# Patient Record
Sex: Female | Born: 1940 | Race: Black or African American | Hispanic: No
Health system: Southern US, Community
[De-identification: ages and names within clinical notes are randomized; demographics above are authoritative.]

---

## 2015-04-10 DIAGNOSIS — G603 Idiopathic progressive neuropathy: Secondary | ICD-10-CM | POA: Diagnosis not present

## 2015-04-10 DIAGNOSIS — G458 Other transient cerebral ischemic attacks and related syndromes: Secondary | ICD-10-CM | POA: Diagnosis not present

## 2015-04-10 DIAGNOSIS — I10 Essential (primary) hypertension: Secondary | ICD-10-CM | POA: Diagnosis not present

## 2015-04-10 DIAGNOSIS — K581 Irritable bowel syndrome with constipation: Secondary | ICD-10-CM | POA: Diagnosis not present

## 2015-04-10 DIAGNOSIS — Z6823 Body mass index (BMI) 23.0-23.9, adult: Secondary | ICD-10-CM | POA: Diagnosis not present

## 2015-04-13 DIAGNOSIS — I6523 Occlusion and stenosis of bilateral carotid arteries: Secondary | ICD-10-CM | POA: Diagnosis not present

## 2015-05-09 DIAGNOSIS — Z23 Encounter for immunization: Secondary | ICD-10-CM | POA: Diagnosis not present

## 2015-07-05 DIAGNOSIS — K219 Gastro-esophageal reflux disease without esophagitis: Secondary | ICD-10-CM | POA: Diagnosis not present

## 2015-07-05 DIAGNOSIS — I10 Essential (primary) hypertension: Secondary | ICD-10-CM | POA: Diagnosis not present

## 2015-07-05 DIAGNOSIS — E785 Hyperlipidemia, unspecified: Secondary | ICD-10-CM | POA: Diagnosis not present

## 2015-07-05 DIAGNOSIS — H9193 Unspecified hearing loss, bilateral: Secondary | ICD-10-CM | POA: Diagnosis not present

## 2015-07-11 DIAGNOSIS — H1852 Epithelial (juvenile) corneal dystrophy: Secondary | ICD-10-CM | POA: Diagnosis not present

## 2015-07-11 DIAGNOSIS — Z961 Presence of intraocular lens: Secondary | ICD-10-CM | POA: Diagnosis not present

## 2015-07-11 DIAGNOSIS — H401132 Primary open-angle glaucoma, bilateral, moderate stage: Secondary | ICD-10-CM | POA: Diagnosis not present

## 2015-07-13 DIAGNOSIS — I10 Essential (primary) hypertension: Secondary | ICD-10-CM | POA: Diagnosis not present

## 2015-07-13 DIAGNOSIS — Z6823 Body mass index (BMI) 23.0-23.9, adult: Secondary | ICD-10-CM | POA: Diagnosis not present

## 2015-07-13 DIAGNOSIS — K581 Irritable bowel syndrome with constipation: Secondary | ICD-10-CM | POA: Diagnosis not present

## 2015-07-20 DIAGNOSIS — Z1231 Encounter for screening mammogram for malignant neoplasm of breast: Secondary | ICD-10-CM | POA: Diagnosis not present

## 2015-08-08 DIAGNOSIS — H401131 Primary open-angle glaucoma, bilateral, mild stage: Secondary | ICD-10-CM | POA: Diagnosis not present

## 2015-10-13 DIAGNOSIS — I10 Essential (primary) hypertension: Secondary | ICD-10-CM | POA: Diagnosis not present

## 2015-10-13 DIAGNOSIS — Z0000 Encounter for general adult medical examination without abnormal findings: Secondary | ICD-10-CM | POA: Diagnosis not present

## 2015-10-13 DIAGNOSIS — J440 Chronic obstructive pulmonary disease with acute lower respiratory infection: Secondary | ICD-10-CM | POA: Diagnosis not present

## 2015-10-13 DIAGNOSIS — Z1389 Encounter for screening for other disorder: Secondary | ICD-10-CM | POA: Diagnosis not present

## 2015-10-16 DIAGNOSIS — Z23 Encounter for immunization: Secondary | ICD-10-CM | POA: Diagnosis not present

## 2016-01-18 DIAGNOSIS — J449 Chronic obstructive pulmonary disease, unspecified: Secondary | ICD-10-CM | POA: Diagnosis not present

## 2016-01-18 DIAGNOSIS — R10817 Generalized abdominal tenderness: Secondary | ICD-10-CM | POA: Diagnosis not present

## 2016-01-18 DIAGNOSIS — J069 Acute upper respiratory infection, unspecified: Secondary | ICD-10-CM | POA: Diagnosis not present

## 2016-01-18 DIAGNOSIS — Z6822 Body mass index (BMI) 22.0-22.9, adult: Secondary | ICD-10-CM | POA: Diagnosis not present

## 2016-01-18 DIAGNOSIS — I700 Atherosclerosis of aorta: Secondary | ICD-10-CM | POA: Diagnosis not present

## 2016-01-18 DIAGNOSIS — J22 Unspecified acute lower respiratory infection: Secondary | ICD-10-CM | POA: Diagnosis not present

## 2016-01-18 DIAGNOSIS — I10 Essential (primary) hypertension: Secondary | ICD-10-CM | POA: Diagnosis not present

## 2016-01-18 DIAGNOSIS — J440 Chronic obstructive pulmonary disease with acute lower respiratory infection: Secondary | ICD-10-CM | POA: Diagnosis not present

## 2016-01-23 DIAGNOSIS — R10817 Generalized abdominal tenderness: Secondary | ICD-10-CM | POA: Diagnosis not present

## 2016-01-23 DIAGNOSIS — R109 Unspecified abdominal pain: Secondary | ICD-10-CM | POA: Diagnosis not present

## 2016-01-23 DIAGNOSIS — I700 Atherosclerosis of aorta: Secondary | ICD-10-CM | POA: Diagnosis not present

## 2016-04-30 DIAGNOSIS — R10817 Generalized abdominal tenderness: Secondary | ICD-10-CM | POA: Diagnosis not present

## 2016-04-30 DIAGNOSIS — I700 Atherosclerosis of aorta: Secondary | ICD-10-CM | POA: Diagnosis not present

## 2016-04-30 DIAGNOSIS — J440 Chronic obstructive pulmonary disease with acute lower respiratory infection: Secondary | ICD-10-CM | POA: Diagnosis not present

## 2016-04-30 DIAGNOSIS — Z6823 Body mass index (BMI) 23.0-23.9, adult: Secondary | ICD-10-CM | POA: Diagnosis not present

## 2016-04-30 DIAGNOSIS — I10 Essential (primary) hypertension: Secondary | ICD-10-CM | POA: Diagnosis not present

## 2016-05-11 DIAGNOSIS — R69 Illness, unspecified: Secondary | ICD-10-CM | POA: Diagnosis not present

## 2016-06-06 DIAGNOSIS — I700 Atherosclerosis of aorta: Secondary | ICD-10-CM | POA: Diagnosis not present

## 2016-06-06 DIAGNOSIS — J440 Chronic obstructive pulmonary disease with acute lower respiratory infection: Secondary | ICD-10-CM | POA: Diagnosis not present

## 2016-06-06 DIAGNOSIS — Z6823 Body mass index (BMI) 23.0-23.9, adult: Secondary | ICD-10-CM | POA: Diagnosis not present

## 2016-06-06 DIAGNOSIS — I10 Essential (primary) hypertension: Secondary | ICD-10-CM | POA: Diagnosis not present

## 2016-06-06 DIAGNOSIS — M542 Cervicalgia: Secondary | ICD-10-CM | POA: Diagnosis not present

## 2016-07-08 DIAGNOSIS — Z6823 Body mass index (BMI) 23.0-23.9, adult: Secondary | ICD-10-CM | POA: Diagnosis not present

## 2016-07-08 DIAGNOSIS — N3080 Other cystitis without hematuria: Secondary | ICD-10-CM | POA: Diagnosis not present

## 2016-07-18 DIAGNOSIS — R87610 Atypical squamous cells of undetermined significance on cytologic smear of cervix (ASC-US): Secondary | ICD-10-CM | POA: Diagnosis not present

## 2016-07-18 DIAGNOSIS — Z01419 Encounter for gynecological examination (general) (routine) without abnormal findings: Secondary | ICD-10-CM | POA: Diagnosis not present

## 2016-07-18 DIAGNOSIS — R300 Dysuria: Secondary | ICD-10-CM | POA: Diagnosis not present

## 2016-07-18 DIAGNOSIS — Z01411 Encounter for gynecological examination (general) (routine) with abnormal findings: Secondary | ICD-10-CM | POA: Diagnosis not present

## 2016-07-18 DIAGNOSIS — N390 Urinary tract infection, site not specified: Secondary | ICD-10-CM | POA: Diagnosis not present

## 2016-07-18 DIAGNOSIS — R634 Abnormal weight loss: Secondary | ICD-10-CM | POA: Diagnosis not present

## 2016-07-30 DIAGNOSIS — Z1231 Encounter for screening mammogram for malignant neoplasm of breast: Secondary | ICD-10-CM | POA: Diagnosis not present

## 2016-08-06 DIAGNOSIS — R634 Abnormal weight loss: Secondary | ICD-10-CM | POA: Diagnosis not present

## 2016-08-06 DIAGNOSIS — D649 Anemia, unspecified: Secondary | ICD-10-CM | POA: Diagnosis not present

## 2016-08-23 DIAGNOSIS — Z6822 Body mass index (BMI) 22.0-22.9, adult: Secondary | ICD-10-CM | POA: Diagnosis not present

## 2016-08-23 DIAGNOSIS — I700 Atherosclerosis of aorta: Secondary | ICD-10-CM | POA: Diagnosis not present

## 2016-08-23 DIAGNOSIS — I10 Essential (primary) hypertension: Secondary | ICD-10-CM | POA: Diagnosis not present

## 2016-08-23 DIAGNOSIS — J440 Chronic obstructive pulmonary disease with acute lower respiratory infection: Secondary | ICD-10-CM | POA: Diagnosis not present

## 2016-08-26 DIAGNOSIS — M21612 Bunion of left foot: Secondary | ICD-10-CM | POA: Diagnosis not present

## 2016-08-26 DIAGNOSIS — E785 Hyperlipidemia, unspecified: Secondary | ICD-10-CM | POA: Diagnosis not present

## 2016-08-26 DIAGNOSIS — R69 Illness, unspecified: Secondary | ICD-10-CM | POA: Diagnosis not present

## 2016-08-26 DIAGNOSIS — Z0000 Encounter for general adult medical examination without abnormal findings: Secondary | ICD-10-CM | POA: Diagnosis not present

## 2016-08-26 DIAGNOSIS — I10 Essential (primary) hypertension: Secondary | ICD-10-CM | POA: Diagnosis not present

## 2016-08-26 DIAGNOSIS — K029 Dental caries, unspecified: Secondary | ICD-10-CM | POA: Diagnosis not present

## 2016-08-26 DIAGNOSIS — R634 Abnormal weight loss: Secondary | ICD-10-CM | POA: Diagnosis not present

## 2016-08-26 DIAGNOSIS — Z6822 Body mass index (BMI) 22.0-22.9, adult: Secondary | ICD-10-CM | POA: Diagnosis not present

## 2016-08-26 DIAGNOSIS — E876 Hypokalemia: Secondary | ICD-10-CM | POA: Diagnosis not present

## 2016-08-26 DIAGNOSIS — M21611 Bunion of right foot: Secondary | ICD-10-CM | POA: Diagnosis not present

## 2016-08-29 DIAGNOSIS — I700 Atherosclerosis of aorta: Secondary | ICD-10-CM | POA: Diagnosis not present

## 2016-08-29 DIAGNOSIS — R634 Abnormal weight loss: Secondary | ICD-10-CM | POA: Diagnosis not present

## 2016-08-29 DIAGNOSIS — R591 Generalized enlarged lymph nodes: Secondary | ICD-10-CM | POA: Diagnosis not present

## 2016-09-04 ENCOUNTER — Other Ambulatory Visit (HOSPITAL_COMMUNITY): Payer: Self-pay | Admitting: Surgery

## 2016-09-04 DIAGNOSIS — R591 Generalized enlarged lymph nodes: Secondary | ICD-10-CM

## 2016-09-10 DIAGNOSIS — R69 Illness, unspecified: Secondary | ICD-10-CM | POA: Diagnosis not present

## 2016-09-10 DIAGNOSIS — R918 Other nonspecific abnormal finding of lung field: Secondary | ICD-10-CM | POA: Diagnosis not present

## 2016-09-10 DIAGNOSIS — I10 Essential (primary) hypertension: Secondary | ICD-10-CM | POA: Diagnosis not present

## 2016-09-10 DIAGNOSIS — Z79899 Other long term (current) drug therapy: Secondary | ICD-10-CM | POA: Diagnosis not present

## 2016-09-10 DIAGNOSIS — Z7982 Long term (current) use of aspirin: Secondary | ICD-10-CM | POA: Diagnosis not present

## 2016-09-10 DIAGNOSIS — J209 Acute bronchitis, unspecified: Secondary | ICD-10-CM | POA: Diagnosis not present

## 2016-09-12 ENCOUNTER — Encounter (HOSPITAL_COMMUNITY): Payer: Self-pay

## 2016-09-12 ENCOUNTER — Encounter (HOSPITAL_COMMUNITY): Payer: Self-pay | Admitting: Radiology

## 2016-09-12 ENCOUNTER — Ambulatory Visit (HOSPITAL_COMMUNITY)
Admission: RE | Admit: 2016-09-12 | Discharge: 2016-09-12 | Disposition: A | Discharge status: Home / Self Care | Payer: Medicare HMO | Source: Ambulatory Visit | Attending: Surgery | Admitting: Surgery

## 2016-09-12 DIAGNOSIS — R591 Generalized enlarged lymph nodes: Secondary | ICD-10-CM | POA: Diagnosis not present

## 2016-09-12 LAB — GLUCOSE, CAPILLARY: GLUCOSE-CAPILLARY: 112 mg/dL — AB (ref 65–99)

## 2016-09-12 MED ORDER — FLUDEOXYGLUCOSE F - 18 (FDG) INJECTION
7.9000 | Freq: Once | INTRAVENOUS | Status: AC
  Administered 2016-09-12: 7.9 via INTRAVENOUS

## 2016-09-13 DIAGNOSIS — D469 Myelodysplastic syndrome, unspecified: Secondary | ICD-10-CM | POA: Diagnosis not present

## 2016-09-13 DIAGNOSIS — E785 Hyperlipidemia, unspecified: Secondary | ICD-10-CM | POA: Diagnosis not present

## 2016-09-13 DIAGNOSIS — Z780 Asymptomatic menopausal state: Secondary | ICD-10-CM | POA: Diagnosis not present

## 2016-09-13 DIAGNOSIS — R05 Cough: Secondary | ICD-10-CM | POA: Diagnosis not present

## 2016-09-13 DIAGNOSIS — R69 Illness, unspecified: Secondary | ICD-10-CM | POA: Diagnosis not present

## 2016-09-13 DIAGNOSIS — R442 Other hallucinations: Secondary | ICD-10-CM | POA: Diagnosis not present

## 2016-09-13 DIAGNOSIS — E873 Alkalosis: Secondary | ICD-10-CM | POA: Diagnosis not present

## 2016-09-13 DIAGNOSIS — B873 Nasopharyngeal myiasis: Secondary | ICD-10-CM | POA: Diagnosis not present

## 2016-09-13 DIAGNOSIS — J9601 Acute respiratory failure with hypoxia: Secondary | ICD-10-CM | POA: Diagnosis not present

## 2016-09-13 DIAGNOSIS — I10 Essential (primary) hypertension: Secondary | ICD-10-CM | POA: Diagnosis not present

## 2016-09-13 DIAGNOSIS — K449 Diaphragmatic hernia without obstruction or gangrene: Secondary | ICD-10-CM | POA: Diagnosis not present

## 2016-09-13 DIAGNOSIS — T380X5A Adverse effect of glucocorticoids and synthetic analogues, initial encounter: Secondary | ICD-10-CM | POA: Diagnosis not present

## 2016-09-13 DIAGNOSIS — J441 Chronic obstructive pulmonary disease with (acute) exacerbation: Secondary | ICD-10-CM | POA: Diagnosis not present

## 2016-09-13 DIAGNOSIS — R443 Hallucinations, unspecified: Secondary | ICD-10-CM | POA: Diagnosis not present

## 2016-09-13 DIAGNOSIS — D46Z Other myelodysplastic syndromes: Secondary | ICD-10-CM | POA: Diagnosis not present

## 2016-09-18 DIAGNOSIS — R69 Illness, unspecified: Secondary | ICD-10-CM | POA: Diagnosis not present

## 2016-09-24 DIAGNOSIS — J440 Chronic obstructive pulmonary disease with acute lower respiratory infection: Secondary | ICD-10-CM | POA: Diagnosis not present

## 2016-09-24 DIAGNOSIS — Z6821 Body mass index (BMI) 21.0-21.9, adult: Secondary | ICD-10-CM | POA: Diagnosis not present

## 2016-09-24 DIAGNOSIS — C946 Myelodysplastic disease, not classified: Secondary | ICD-10-CM | POA: Diagnosis not present

## 2016-10-01 ENCOUNTER — Encounter (HOSPITAL_COMMUNITY): Payer: Medicare HMO

## 2016-10-09 ENCOUNTER — Other Ambulatory Visit (HOSPITAL_COMMUNITY): Payer: Self-pay | Admitting: Surgery

## 2016-10-09 DIAGNOSIS — R591 Generalized enlarged lymph nodes: Secondary | ICD-10-CM

## 2016-10-17 ENCOUNTER — Ambulatory Visit (HOSPITAL_COMMUNITY)
Admission: RE | Admit: 2016-10-17 | Discharge: 2016-10-17 | Disposition: A | Discharge status: Home / Self Care | Payer: Medicare HMO | Source: Ambulatory Visit | Attending: Surgery | Admitting: Surgery

## 2016-10-17 DIAGNOSIS — R911 Solitary pulmonary nodule: Secondary | ICD-10-CM | POA: Insufficient documentation

## 2016-10-17 DIAGNOSIS — R938 Abnormal findings on diagnostic imaging of other specified body structures: Secondary | ICD-10-CM | POA: Insufficient documentation

## 2016-10-17 DIAGNOSIS — R634 Abnormal weight loss: Secondary | ICD-10-CM | POA: Diagnosis not present

## 2016-10-17 DIAGNOSIS — R591 Generalized enlarged lymph nodes: Secondary | ICD-10-CM | POA: Diagnosis present

## 2016-10-17 LAB — GLUCOSE, CAPILLARY: Glucose-Capillary: 97 mg/dL (ref 65–99)

## 2016-10-17 MED ORDER — FLUDEOXYGLUCOSE F - 18 (FDG) INJECTION
7.0200 | Freq: Once | INTRAVENOUS | Status: AC | PRN
  Administered 2016-10-17: 7.02 via INTRAVENOUS

## 2016-10-21 ENCOUNTER — Other Ambulatory Visit (HOSPITAL_COMMUNITY)
Admission: RE | Admit: 2016-10-21 | Discharge: 2016-10-21 | Disposition: A | Discharge status: Home / Self Care | Payer: Medicare HMO | Source: Ambulatory Visit | Attending: Internal Medicine | Admitting: Internal Medicine

## 2016-10-21 DIAGNOSIS — C92Z0 Other myeloid leukemia not having achieved remission: Secondary | ICD-10-CM | POA: Diagnosis not present

## 2016-10-21 DIAGNOSIS — C946 Myelodysplastic disease, not classified: Secondary | ICD-10-CM | POA: Diagnosis present

## 2016-10-21 DIAGNOSIS — D469 Myelodysplastic syndrome, unspecified: Secondary | ICD-10-CM | POA: Diagnosis not present

## 2016-10-21 DIAGNOSIS — D649 Anemia, unspecified: Secondary | ICD-10-CM | POA: Diagnosis not present

## 2016-10-23 DIAGNOSIS — R591 Generalized enlarged lymph nodes: Secondary | ICD-10-CM | POA: Diagnosis not present

## 2016-10-24 ENCOUNTER — Encounter: Payer: Self-pay | Admitting: *Deleted

## 2016-10-24 NOTE — Progress Notes (Signed)
Oncology Nurse Navigator Documentation  Oncology Nurse Navigator Flowsheets 10/24/2016  Navigator Location [facility]  Referral date to RadOnc/MedOnc 10/24/2016  Navigator Encounter Type Telephone/Dr. Worthy Flank desk nurse received a call from patient.  She is a self referral and has bone marrow biopsy pending.  I updated new patient coordinator to call patient with an appt to see Dr. Julien Nordmann, first available.   Telephone Outgoing Call  Treatment Phase Abnormal Scans  Barriers/Navigation Needs Coordination of Care  Interventions Coordination of Care  Coordination of Care Appts  Acuity Level 2  Acuity Level 2 Assistance expediting appointments  Time Spent with Patient 30

## 2016-10-25 ENCOUNTER — Encounter: Payer: Self-pay | Admitting: Internal Medicine

## 2016-10-25 ENCOUNTER — Telehealth: Payer: Self-pay | Admitting: Internal Medicine

## 2016-10-25 ENCOUNTER — Other Ambulatory Visit: Payer: Medicare HMO

## 2016-10-25 DIAGNOSIS — Z87891 Personal history of nicotine dependence: Secondary | ICD-10-CM | POA: Diagnosis not present

## 2016-10-25 DIAGNOSIS — C9200 Acute myeloblastic leukemia, not having achieved remission: Secondary | ICD-10-CM | POA: Diagnosis not present

## 2016-10-25 DIAGNOSIS — D509 Iron deficiency anemia, unspecified: Secondary | ICD-10-CM | POA: Diagnosis not present

## 2016-10-25 NOTE — Telephone Encounter (Signed)
Attempted to contact the pt to schedule an appt., no answer. Lft vm for the pt to call back. Scheduled the pt an appt to see Dr. Julien Nordmann on 7/3 at 215pm. Message sent to [facility], thoracic navigator, about the appt. Letter maild to the pt.

## 2016-10-27 DIAGNOSIS — R69 Illness, unspecified: Secondary | ICD-10-CM | POA: Diagnosis not present

## 2016-10-29 DIAGNOSIS — J449 Chronic obstructive pulmonary disease, unspecified: Secondary | ICD-10-CM | POA: Diagnosis not present

## 2016-10-29 DIAGNOSIS — R630 Anorexia: Secondary | ICD-10-CM | POA: Diagnosis not present

## 2016-10-29 DIAGNOSIS — K449 Diaphragmatic hernia without obstruction or gangrene: Secondary | ICD-10-CM | POA: Diagnosis not present

## 2016-10-29 DIAGNOSIS — R0602 Shortness of breath: Secondary | ICD-10-CM | POA: Diagnosis not present

## 2016-10-29 DIAGNOSIS — C9200 Acute myeloblastic leukemia, not having achieved remission: Secondary | ICD-10-CM | POA: Diagnosis not present

## 2016-10-29 DIAGNOSIS — I351 Nonrheumatic aortic (valve) insufficiency: Secondary | ICD-10-CM | POA: Diagnosis not present

## 2016-10-29 DIAGNOSIS — I083 Combined rheumatic disorders of mitral, aortic and tricuspid valves: Secondary | ICD-10-CM | POA: Diagnosis not present

## 2016-10-29 DIAGNOSIS — E785 Hyperlipidemia, unspecified: Secondary | ICD-10-CM | POA: Diagnosis not present

## 2016-10-29 DIAGNOSIS — R5081 Fever presenting with conditions classified elsewhere: Secondary | ICD-10-CM | POA: Diagnosis not present

## 2016-10-29 DIAGNOSIS — I10 Essential (primary) hypertension: Secondary | ICD-10-CM | POA: Diagnosis not present

## 2016-10-29 DIAGNOSIS — D709 Neutropenia, unspecified: Secondary | ICD-10-CM | POA: Diagnosis not present

## 2016-10-29 DIAGNOSIS — R69 Illness, unspecified: Secondary | ICD-10-CM | POA: Diagnosis not present

## 2016-10-29 DIAGNOSIS — J9811 Atelectasis: Secondary | ICD-10-CM | POA: Diagnosis not present

## 2016-10-29 DIAGNOSIS — E876 Hypokalemia: Secondary | ICD-10-CM | POA: Diagnosis not present

## 2016-10-30 DIAGNOSIS — C9200 Acute myeloblastic leukemia, not having achieved remission: Secondary | ICD-10-CM | POA: Diagnosis not present

## 2016-10-31 DIAGNOSIS — C9200 Acute myeloblastic leukemia, not having achieved remission: Secondary | ICD-10-CM | POA: Diagnosis not present

## 2016-11-01 DIAGNOSIS — C9200 Acute myeloblastic leukemia, not having achieved remission: Secondary | ICD-10-CM | POA: Diagnosis not present

## 2016-11-02 DIAGNOSIS — C9200 Acute myeloblastic leukemia, not having achieved remission: Secondary | ICD-10-CM | POA: Diagnosis not present

## 2016-11-03 DIAGNOSIS — C9200 Acute myeloblastic leukemia, not having achieved remission: Secondary | ICD-10-CM | POA: Diagnosis not present

## 2016-11-04 DIAGNOSIS — I10 Essential (primary) hypertension: Secondary | ICD-10-CM | POA: Diagnosis not present

## 2016-11-04 DIAGNOSIS — D709 Neutropenia, unspecified: Secondary | ICD-10-CM | POA: Diagnosis not present

## 2016-11-04 DIAGNOSIS — D61810 Antineoplastic chemotherapy induced pancytopenia: Secondary | ICD-10-CM | POA: Diagnosis not present

## 2016-11-04 DIAGNOSIS — C9200 Acute myeloblastic leukemia, not having achieved remission: Secondary | ICD-10-CM | POA: Diagnosis not present

## 2016-11-04 DIAGNOSIS — K219 Gastro-esophageal reflux disease without esophagitis: Secondary | ICD-10-CM | POA: Diagnosis not present

## 2016-11-04 DIAGNOSIS — J449 Chronic obstructive pulmonary disease, unspecified: Secondary | ICD-10-CM | POA: Diagnosis not present

## 2016-11-05 ENCOUNTER — Institutional Professional Consult (permissible substitution): Payer: Medicare HMO | Admitting: Pulmonary Disease

## 2016-11-05 DIAGNOSIS — C9200 Acute myeloblastic leukemia, not having achieved remission: Secondary | ICD-10-CM | POA: Diagnosis not present

## 2016-11-05 DIAGNOSIS — D61810 Antineoplastic chemotherapy induced pancytopenia: Secondary | ICD-10-CM | POA: Diagnosis not present

## 2016-11-06 ENCOUNTER — Telehealth: Payer: Self-pay | Admitting: Internal Medicine

## 2016-11-06 ENCOUNTER — Encounter: Payer: Self-pay | Admitting: Internal Medicine

## 2016-11-06 ENCOUNTER — Encounter (HOSPITAL_COMMUNITY): Payer: Self-pay

## 2016-11-06 ENCOUNTER — Telehealth: Payer: Self-pay | Admitting: Pulmonary Disease

## 2016-11-06 DIAGNOSIS — D701 Agranulocytosis secondary to cancer chemotherapy: Secondary | ICD-10-CM | POA: Diagnosis not present

## 2016-11-06 DIAGNOSIS — K219 Gastro-esophageal reflux disease without esophagitis: Secondary | ICD-10-CM | POA: Diagnosis not present

## 2016-11-06 DIAGNOSIS — K1370 Unspecified lesions of oral mucosa: Secondary | ICD-10-CM | POA: Diagnosis not present

## 2016-11-06 DIAGNOSIS — J449 Chronic obstructive pulmonary disease, unspecified: Secondary | ICD-10-CM | POA: Diagnosis not present

## 2016-11-06 DIAGNOSIS — I10 Essential (primary) hypertension: Secondary | ICD-10-CM | POA: Diagnosis not present

## 2016-11-06 DIAGNOSIS — C9200 Acute myeloblastic leukemia, not having achieved remission: Secondary | ICD-10-CM | POA: Diagnosis not present

## 2016-11-06 DIAGNOSIS — R070 Pain in throat: Secondary | ICD-10-CM | POA: Diagnosis not present

## 2016-11-06 DIAGNOSIS — D61810 Antineoplastic chemotherapy induced pancytopenia: Secondary | ICD-10-CM | POA: Diagnosis not present

## 2016-11-06 NOTE — Telephone Encounter (Signed)
A radiology report was received from [place] for [place]. After reviewing the report JN noted the patient was never seen by him. Upon reviewing the patient's past appointment list, she was scheduled to see him on 6/26 for a pulmonary consult but cancelled the appointment. Pt was contacted to reschedule the appointment. She did not answer and voicemail was unable to be left. Will attempt to contact patient at a later time.

## 2016-11-06 NOTE — Telephone Encounter (Signed)
Attempted to contact the pt regarding rescheduling her appt. Appt has been rescheduled for the pt to see Dr. Julien Nordmann on 7/10 at 215pm. Will mail the pt a new letter.

## 2016-11-07 DIAGNOSIS — D701 Agranulocytosis secondary to cancer chemotherapy: Secondary | ICD-10-CM | POA: Diagnosis not present

## 2016-11-07 DIAGNOSIS — K1370 Unspecified lesions of oral mucosa: Secondary | ICD-10-CM | POA: Diagnosis not present

## 2016-11-07 DIAGNOSIS — R070 Pain in throat: Secondary | ICD-10-CM | POA: Diagnosis not present

## 2016-11-07 DIAGNOSIS — I10 Essential (primary) hypertension: Secondary | ICD-10-CM | POA: Diagnosis not present

## 2016-11-07 DIAGNOSIS — C9200 Acute myeloblastic leukemia, not having achieved remission: Secondary | ICD-10-CM | POA: Diagnosis not present

## 2016-11-07 DIAGNOSIS — D61810 Antineoplastic chemotherapy induced pancytopenia: Secondary | ICD-10-CM | POA: Diagnosis not present

## 2016-11-07 DIAGNOSIS — J449 Chronic obstructive pulmonary disease, unspecified: Secondary | ICD-10-CM | POA: Diagnosis not present

## 2016-11-07 DIAGNOSIS — K219 Gastro-esophageal reflux disease without esophagitis: Secondary | ICD-10-CM | POA: Diagnosis not present

## 2016-11-08 DIAGNOSIS — C9200 Acute myeloblastic leukemia, not having achieved remission: Secondary | ICD-10-CM | POA: Diagnosis not present

## 2016-11-08 DIAGNOSIS — Z5111 Encounter for antineoplastic chemotherapy: Secondary | ICD-10-CM | POA: Diagnosis not present

## 2016-11-08 DIAGNOSIS — R070 Pain in throat: Secondary | ICD-10-CM | POA: Diagnosis not present

## 2016-11-08 DIAGNOSIS — D61810 Antineoplastic chemotherapy induced pancytopenia: Secondary | ICD-10-CM | POA: Diagnosis not present

## 2016-11-08 DIAGNOSIS — D701 Agranulocytosis secondary to cancer chemotherapy: Secondary | ICD-10-CM | POA: Diagnosis not present

## 2016-11-09 DIAGNOSIS — C9200 Acute myeloblastic leukemia, not having achieved remission: Secondary | ICD-10-CM | POA: Diagnosis not present

## 2016-11-09 DIAGNOSIS — Z5111 Encounter for antineoplastic chemotherapy: Secondary | ICD-10-CM | POA: Diagnosis not present

## 2016-11-09 DIAGNOSIS — R070 Pain in throat: Secondary | ICD-10-CM | POA: Diagnosis not present

## 2016-11-09 DIAGNOSIS — D61810 Antineoplastic chemotherapy induced pancytopenia: Secondary | ICD-10-CM | POA: Diagnosis not present

## 2016-11-09 DIAGNOSIS — K5900 Constipation, unspecified: Secondary | ICD-10-CM | POA: Diagnosis not present

## 2016-11-09 DIAGNOSIS — D701 Agranulocytosis secondary to cancer chemotherapy: Secondary | ICD-10-CM | POA: Diagnosis not present

## 2016-11-10 DIAGNOSIS — R0600 Dyspnea, unspecified: Secondary | ICD-10-CM | POA: Diagnosis not present

## 2016-11-10 DIAGNOSIS — D61810 Antineoplastic chemotherapy induced pancytopenia: Secondary | ICD-10-CM | POA: Diagnosis not present

## 2016-11-10 DIAGNOSIS — R070 Pain in throat: Secondary | ICD-10-CM | POA: Diagnosis not present

## 2016-11-10 DIAGNOSIS — R918 Other nonspecific abnormal finding of lung field: Secondary | ICD-10-CM | POA: Diagnosis not present

## 2016-11-10 DIAGNOSIS — C9200 Acute myeloblastic leukemia, not having achieved remission: Secondary | ICD-10-CM | POA: Diagnosis not present

## 2016-11-10 DIAGNOSIS — Z5111 Encounter for antineoplastic chemotherapy: Secondary | ICD-10-CM | POA: Diagnosis not present

## 2016-11-10 DIAGNOSIS — D701 Agranulocytosis secondary to cancer chemotherapy: Secondary | ICD-10-CM | POA: Diagnosis not present

## 2016-11-10 DIAGNOSIS — K5900 Constipation, unspecified: Secondary | ICD-10-CM | POA: Diagnosis not present

## 2016-11-11 DIAGNOSIS — R112 Nausea with vomiting, unspecified: Secondary | ICD-10-CM | POA: Diagnosis not present

## 2016-11-11 DIAGNOSIS — E876 Hypokalemia: Secondary | ICD-10-CM | POA: Diagnosis not present

## 2016-11-11 DIAGNOSIS — G4700 Insomnia, unspecified: Secondary | ICD-10-CM | POA: Diagnosis not present

## 2016-11-11 DIAGNOSIS — Z5111 Encounter for antineoplastic chemotherapy: Secondary | ICD-10-CM | POA: Diagnosis not present

## 2016-11-11 DIAGNOSIS — C9200 Acute myeloblastic leukemia, not having achieved remission: Secondary | ICD-10-CM | POA: Diagnosis not present

## 2016-11-11 DIAGNOSIS — K1379 Other lesions of oral mucosa: Secondary | ICD-10-CM | POA: Diagnosis not present

## 2016-11-11 DIAGNOSIS — R51 Headache: Secondary | ICD-10-CM | POA: Diagnosis not present

## 2016-11-11 DIAGNOSIS — D61810 Antineoplastic chemotherapy induced pancytopenia: Secondary | ICD-10-CM | POA: Diagnosis not present

## 2016-11-11 NOTE — Telephone Encounter (Signed)
Contacted patient to reschedule appointment if needed. Patient did not answer and a message could not be left. Will try again at later time.

## 2016-11-12 ENCOUNTER — Other Ambulatory Visit: Payer: Medicare HMO

## 2016-11-12 ENCOUNTER — Ambulatory Visit: Payer: Medicare HMO | Admitting: Internal Medicine

## 2016-11-12 DIAGNOSIS — D61810 Antineoplastic chemotherapy induced pancytopenia: Secondary | ICD-10-CM | POA: Diagnosis not present

## 2016-11-12 DIAGNOSIS — C9200 Acute myeloblastic leukemia, not having achieved remission: Secondary | ICD-10-CM | POA: Diagnosis not present

## 2016-11-12 DIAGNOSIS — R112 Nausea with vomiting, unspecified: Secondary | ICD-10-CM | POA: Diagnosis not present

## 2016-11-12 DIAGNOSIS — E876 Hypokalemia: Secondary | ICD-10-CM | POA: Diagnosis not present

## 2016-11-12 DIAGNOSIS — G4700 Insomnia, unspecified: Secondary | ICD-10-CM | POA: Diagnosis not present

## 2016-11-12 DIAGNOSIS — K1379 Other lesions of oral mucosa: Secondary | ICD-10-CM | POA: Diagnosis not present

## 2016-11-12 DIAGNOSIS — Z5111 Encounter for antineoplastic chemotherapy: Secondary | ICD-10-CM | POA: Diagnosis not present

## 2016-11-12 DIAGNOSIS — R51 Headache: Secondary | ICD-10-CM | POA: Diagnosis not present

## 2016-11-13 DIAGNOSIS — R51 Headache: Secondary | ICD-10-CM | POA: Diagnosis not present

## 2016-11-13 DIAGNOSIS — C9200 Acute myeloblastic leukemia, not having achieved remission: Secondary | ICD-10-CM | POA: Diagnosis not present

## 2016-11-13 DIAGNOSIS — G4700 Insomnia, unspecified: Secondary | ICD-10-CM | POA: Diagnosis not present

## 2016-11-13 DIAGNOSIS — Z5111 Encounter for antineoplastic chemotherapy: Secondary | ICD-10-CM | POA: Diagnosis not present

## 2016-11-13 DIAGNOSIS — D61810 Antineoplastic chemotherapy induced pancytopenia: Secondary | ICD-10-CM | POA: Diagnosis not present

## 2016-11-13 DIAGNOSIS — E876 Hypokalemia: Secondary | ICD-10-CM | POA: Diagnosis not present

## 2016-11-13 DIAGNOSIS — R112 Nausea with vomiting, unspecified: Secondary | ICD-10-CM | POA: Diagnosis not present

## 2016-11-13 DIAGNOSIS — K1379 Other lesions of oral mucosa: Secondary | ICD-10-CM | POA: Diagnosis not present

## 2016-11-14 DIAGNOSIS — R51 Headache: Secondary | ICD-10-CM | POA: Diagnosis not present

## 2016-11-14 DIAGNOSIS — C9200 Acute myeloblastic leukemia, not having achieved remission: Secondary | ICD-10-CM | POA: Diagnosis not present

## 2016-11-14 DIAGNOSIS — R112 Nausea with vomiting, unspecified: Secondary | ICD-10-CM | POA: Diagnosis not present

## 2016-11-14 DIAGNOSIS — G4700 Insomnia, unspecified: Secondary | ICD-10-CM | POA: Diagnosis not present

## 2016-11-14 DIAGNOSIS — I083 Combined rheumatic disorders of mitral, aortic and tricuspid valves: Secondary | ICD-10-CM | POA: Diagnosis not present

## 2016-11-14 DIAGNOSIS — D61810 Antineoplastic chemotherapy induced pancytopenia: Secondary | ICD-10-CM | POA: Diagnosis not present

## 2016-11-14 DIAGNOSIS — E876 Hypokalemia: Secondary | ICD-10-CM | POA: Diagnosis not present

## 2016-11-14 DIAGNOSIS — Z5111 Encounter for antineoplastic chemotherapy: Secondary | ICD-10-CM | POA: Diagnosis not present

## 2016-11-14 DIAGNOSIS — K1379 Other lesions of oral mucosa: Secondary | ICD-10-CM | POA: Diagnosis not present

## 2016-11-15 DIAGNOSIS — K1379 Other lesions of oral mucosa: Secondary | ICD-10-CM | POA: Diagnosis not present

## 2016-11-15 DIAGNOSIS — C9200 Acute myeloblastic leukemia, not having achieved remission: Secondary | ICD-10-CM | POA: Diagnosis not present

## 2016-11-15 DIAGNOSIS — E876 Hypokalemia: Secondary | ICD-10-CM | POA: Diagnosis not present

## 2016-11-15 DIAGNOSIS — D61810 Antineoplastic chemotherapy induced pancytopenia: Secondary | ICD-10-CM | POA: Diagnosis not present

## 2016-11-15 DIAGNOSIS — Z5111 Encounter for antineoplastic chemotherapy: Secondary | ICD-10-CM | POA: Diagnosis not present

## 2016-11-15 DIAGNOSIS — G4700 Insomnia, unspecified: Secondary | ICD-10-CM | POA: Diagnosis not present

## 2016-11-15 DIAGNOSIS — R112 Nausea with vomiting, unspecified: Secondary | ICD-10-CM | POA: Diagnosis not present

## 2016-11-15 DIAGNOSIS — R51 Headache: Secondary | ICD-10-CM | POA: Diagnosis not present

## 2016-11-15 LAB — CHROMOSOME ANALYSIS, BONE MARROW

## 2016-11-16 DIAGNOSIS — R112 Nausea with vomiting, unspecified: Secondary | ICD-10-CM | POA: Diagnosis not present

## 2016-11-16 DIAGNOSIS — Z5111 Encounter for antineoplastic chemotherapy: Secondary | ICD-10-CM | POA: Diagnosis not present

## 2016-11-16 DIAGNOSIS — G4700 Insomnia, unspecified: Secondary | ICD-10-CM | POA: Diagnosis not present

## 2016-11-16 DIAGNOSIS — R51 Headache: Secondary | ICD-10-CM | POA: Diagnosis not present

## 2016-11-16 DIAGNOSIS — C9200 Acute myeloblastic leukemia, not having achieved remission: Secondary | ICD-10-CM | POA: Diagnosis not present

## 2016-11-16 DIAGNOSIS — D61810 Antineoplastic chemotherapy induced pancytopenia: Secondary | ICD-10-CM | POA: Diagnosis not present

## 2016-11-16 DIAGNOSIS — E876 Hypokalemia: Secondary | ICD-10-CM | POA: Diagnosis not present

## 2016-11-16 DIAGNOSIS — K1379 Other lesions of oral mucosa: Secondary | ICD-10-CM | POA: Diagnosis not present

## 2016-11-17 DIAGNOSIS — G4700 Insomnia, unspecified: Secondary | ICD-10-CM | POA: Diagnosis not present

## 2016-11-17 DIAGNOSIS — D61810 Antineoplastic chemotherapy induced pancytopenia: Secondary | ICD-10-CM | POA: Diagnosis not present

## 2016-11-17 DIAGNOSIS — R51 Headache: Secondary | ICD-10-CM | POA: Diagnosis not present

## 2016-11-17 DIAGNOSIS — C9200 Acute myeloblastic leukemia, not having achieved remission: Secondary | ICD-10-CM | POA: Diagnosis not present

## 2016-11-17 DIAGNOSIS — K1379 Other lesions of oral mucosa: Secondary | ICD-10-CM | POA: Diagnosis not present

## 2016-11-17 DIAGNOSIS — E876 Hypokalemia: Secondary | ICD-10-CM | POA: Diagnosis not present

## 2016-11-17 DIAGNOSIS — R112 Nausea with vomiting, unspecified: Secondary | ICD-10-CM | POA: Diagnosis not present

## 2016-11-17 DIAGNOSIS — Z5111 Encounter for antineoplastic chemotherapy: Secondary | ICD-10-CM | POA: Diagnosis not present

## 2016-11-18 DIAGNOSIS — C9200 Acute myeloblastic leukemia, not having achieved remission: Secondary | ICD-10-CM | POA: Diagnosis not present

## 2016-11-19 ENCOUNTER — Other Ambulatory Visit: Payer: Self-pay | Admitting: Medical Oncology

## 2016-11-19 ENCOUNTER — Telehealth: Payer: Self-pay | Admitting: Medical Oncology

## 2016-11-19 ENCOUNTER — Other Ambulatory Visit: Payer: Medicare HMO

## 2016-11-19 ENCOUNTER — Ambulatory Visit: Payer: Medicare HMO | Admitting: Internal Medicine

## 2016-11-19 DIAGNOSIS — C9200 Acute myeloblastic leukemia, not having achieved remission: Secondary | ICD-10-CM | POA: Diagnosis not present

## 2016-11-19 NOTE — Telephone Encounter (Signed)
Appt cancelled and family aware- Pt at [facility].

## 2016-11-20 DIAGNOSIS — C9200 Acute myeloblastic leukemia, not having achieved remission: Secondary | ICD-10-CM | POA: Diagnosis not present

## 2016-11-21 DIAGNOSIS — C9200 Acute myeloblastic leukemia, not having achieved remission: Secondary | ICD-10-CM | POA: Diagnosis not present

## 2016-11-22 DIAGNOSIS — E876 Hypokalemia: Secondary | ICD-10-CM | POA: Diagnosis not present

## 2016-11-22 DIAGNOSIS — E785 Hyperlipidemia, unspecified: Secondary | ICD-10-CM | POA: Diagnosis not present

## 2016-11-22 DIAGNOSIS — R630 Anorexia: Secondary | ICD-10-CM | POA: Diagnosis not present

## 2016-11-22 DIAGNOSIS — D709 Neutropenia, unspecified: Secondary | ICD-10-CM | POA: Diagnosis not present

## 2016-11-22 DIAGNOSIS — C9200 Acute myeloblastic leukemia, not having achieved remission: Secondary | ICD-10-CM | POA: Diagnosis not present

## 2016-11-22 DIAGNOSIS — R69 Illness, unspecified: Secondary | ICD-10-CM | POA: Diagnosis not present

## 2016-11-22 DIAGNOSIS — I10 Essential (primary) hypertension: Secondary | ICD-10-CM | POA: Diagnosis not present

## 2016-11-22 DIAGNOSIS — K449 Diaphragmatic hernia without obstruction or gangrene: Secondary | ICD-10-CM | POA: Diagnosis not present

## 2016-11-22 DIAGNOSIS — R5081 Fever presenting with conditions classified elsewhere: Secondary | ICD-10-CM | POA: Diagnosis not present

## 2016-11-25 ENCOUNTER — Encounter: Payer: Self-pay | Admitting: Pulmonary Disease

## 2016-11-25 DIAGNOSIS — Z452 Encounter for adjustment and management of vascular access device: Secondary | ICD-10-CM | POA: Diagnosis not present

## 2016-11-25 DIAGNOSIS — C9200 Acute myeloblastic leukemia, not having achieved remission: Secondary | ICD-10-CM | POA: Diagnosis not present

## 2016-11-25 DIAGNOSIS — C3490 Malignant neoplasm of unspecified part of unspecified bronchus or lung: Secondary | ICD-10-CM | POA: Diagnosis not present

## 2016-11-25 NOTE — Telephone Encounter (Signed)
ATC patient to reschedule consult. Pt did not answer and voicemail was unable to be left. A letter instructing patient to contact the office is being sent to address on file. Nothing further is needed.

## 2016-11-29 DIAGNOSIS — E876 Hypokalemia: Secondary | ICD-10-CM | POA: Diagnosis not present

## 2016-11-29 DIAGNOSIS — Z792 Long term (current) use of antibiotics: Secondary | ICD-10-CM | POA: Diagnosis not present

## 2016-11-29 DIAGNOSIS — Z79899 Other long term (current) drug therapy: Secondary | ICD-10-CM | POA: Diagnosis not present

## 2016-11-29 DIAGNOSIS — E785 Hyperlipidemia, unspecified: Secondary | ICD-10-CM | POA: Diagnosis not present

## 2016-11-29 DIAGNOSIS — D61818 Other pancytopenia: Secondary | ICD-10-CM | POA: Diagnosis not present

## 2016-11-29 DIAGNOSIS — I10 Essential (primary) hypertension: Secondary | ICD-10-CM | POA: Diagnosis not present

## 2016-11-29 DIAGNOSIS — R634 Abnormal weight loss: Secondary | ICD-10-CM | POA: Diagnosis not present

## 2016-11-29 DIAGNOSIS — R630 Anorexia: Secondary | ICD-10-CM | POA: Diagnosis not present

## 2016-11-29 DIAGNOSIS — C9200 Acute myeloblastic leukemia, not having achieved remission: Secondary | ICD-10-CM | POA: Diagnosis not present

## 2016-11-29 DIAGNOSIS — G4700 Insomnia, unspecified: Secondary | ICD-10-CM | POA: Diagnosis not present

## 2016-12-02 DIAGNOSIS — C9200 Acute myeloblastic leukemia, not having achieved remission: Secondary | ICD-10-CM | POA: Diagnosis not present

## 2016-12-02 DIAGNOSIS — Z452 Encounter for adjustment and management of vascular access device: Secondary | ICD-10-CM | POA: Diagnosis not present

## 2016-12-03 DIAGNOSIS — C9200 Acute myeloblastic leukemia, not having achieved remission: Secondary | ICD-10-CM | POA: Diagnosis not present

## 2016-12-03 DIAGNOSIS — Z452 Encounter for adjustment and management of vascular access device: Secondary | ICD-10-CM | POA: Diagnosis not present

## 2016-12-05 DIAGNOSIS — Z452 Encounter for adjustment and management of vascular access device: Secondary | ICD-10-CM | POA: Diagnosis not present

## 2016-12-05 DIAGNOSIS — C9200 Acute myeloblastic leukemia, not having achieved remission: Secondary | ICD-10-CM | POA: Diagnosis not present

## 2016-12-09 DIAGNOSIS — C9200 Acute myeloblastic leukemia, not having achieved remission: Secondary | ICD-10-CM | POA: Diagnosis not present

## 2016-12-09 DIAGNOSIS — Z452 Encounter for adjustment and management of vascular access device: Secondary | ICD-10-CM | POA: Diagnosis not present

## 2016-12-12 DIAGNOSIS — Z452 Encounter for adjustment and management of vascular access device: Secondary | ICD-10-CM | POA: Diagnosis not present

## 2016-12-12 DIAGNOSIS — C9200 Acute myeloblastic leukemia, not having achieved remission: Secondary | ICD-10-CM | POA: Diagnosis not present

## 2016-12-13 DIAGNOSIS — E785 Hyperlipidemia, unspecified: Secondary | ICD-10-CM | POA: Diagnosis not present

## 2016-12-13 DIAGNOSIS — I10 Essential (primary) hypertension: Secondary | ICD-10-CM | POA: Diagnosis not present

## 2016-12-13 DIAGNOSIS — C9200 Acute myeloblastic leukemia, not having achieved remission: Secondary | ICD-10-CM | POA: Diagnosis not present

## 2016-12-13 DIAGNOSIS — J449 Chronic obstructive pulmonary disease, unspecified: Secondary | ICD-10-CM | POA: Diagnosis not present

## 2016-12-13 DIAGNOSIS — R630 Anorexia: Secondary | ICD-10-CM | POA: Diagnosis not present

## 2016-12-13 DIAGNOSIS — G4700 Insomnia, unspecified: Secondary | ICD-10-CM | POA: Diagnosis not present

## 2016-12-13 DIAGNOSIS — R432 Parageusia: Secondary | ICD-10-CM | POA: Diagnosis not present

## 2016-12-13 DIAGNOSIS — E876 Hypokalemia: Secondary | ICD-10-CM | POA: Diagnosis not present

## 2016-12-16 DIAGNOSIS — C9200 Acute myeloblastic leukemia, not having achieved remission: Secondary | ICD-10-CM | POA: Diagnosis not present

## 2016-12-19 DIAGNOSIS — C9200 Acute myeloblastic leukemia, not having achieved remission: Secondary | ICD-10-CM | POA: Diagnosis not present

## 2016-12-23 DIAGNOSIS — C9200 Acute myeloblastic leukemia, not having achieved remission: Secondary | ICD-10-CM | POA: Diagnosis not present

## 2016-12-26 DIAGNOSIS — C9100 Acute lymphoblastic leukemia not having achieved remission: Secondary | ICD-10-CM | POA: Diagnosis not present

## 2016-12-26 DIAGNOSIS — Z6820 Body mass index (BMI) 20.0-20.9, adult: Secondary | ICD-10-CM | POA: Diagnosis not present

## 2016-12-26 DIAGNOSIS — C9200 Acute myeloblastic leukemia, not having achieved remission: Secondary | ICD-10-CM | POA: Diagnosis not present

## 2016-12-26 DIAGNOSIS — J440 Chronic obstructive pulmonary disease with acute lower respiratory infection: Secondary | ICD-10-CM | POA: Diagnosis not present

## 2016-12-30 DIAGNOSIS — C9200 Acute myeloblastic leukemia, not having achieved remission: Secondary | ICD-10-CM | POA: Diagnosis not present

## 2017-01-02 DIAGNOSIS — C9200 Acute myeloblastic leukemia, not having achieved remission: Secondary | ICD-10-CM | POA: Diagnosis not present

## 2017-01-06 DIAGNOSIS — C9200 Acute myeloblastic leukemia, not having achieved remission: Secondary | ICD-10-CM | POA: Diagnosis not present

## 2017-01-09 DIAGNOSIS — C9200 Acute myeloblastic leukemia, not having achieved remission: Secondary | ICD-10-CM | POA: Diagnosis not present

## 2017-01-10 DIAGNOSIS — I10 Essential (primary) hypertension: Secondary | ICD-10-CM | POA: Diagnosis not present

## 2017-01-10 DIAGNOSIS — C9200 Acute myeloblastic leukemia, not having achieved remission: Secondary | ICD-10-CM | POA: Diagnosis not present

## 2017-01-10 DIAGNOSIS — E785 Hyperlipidemia, unspecified: Secondary | ICD-10-CM | POA: Diagnosis not present

## 2017-01-10 DIAGNOSIS — R432 Parageusia: Secondary | ICD-10-CM | POA: Diagnosis not present

## 2017-01-10 DIAGNOSIS — R630 Anorexia: Secondary | ICD-10-CM | POA: Diagnosis not present

## 2017-01-10 DIAGNOSIS — K5900 Constipation, unspecified: Secondary | ICD-10-CM | POA: Diagnosis not present

## 2017-01-17 DIAGNOSIS — Z7689 Persons encountering health services in other specified circumstances: Secondary | ICD-10-CM | POA: Diagnosis not present

## 2017-01-17 DIAGNOSIS — C9200 Acute myeloblastic leukemia, not having achieved remission: Secondary | ICD-10-CM | POA: Diagnosis not present

## 2017-01-23 DIAGNOSIS — C9200 Acute myeloblastic leukemia, not having achieved remission: Secondary | ICD-10-CM | POA: Diagnosis not present

## 2017-02-05 DIAGNOSIS — C9200 Acute myeloblastic leukemia, not having achieved remission: Secondary | ICD-10-CM | POA: Diagnosis not present

## 2017-02-06 DIAGNOSIS — Z87891 Personal history of nicotine dependence: Secondary | ICD-10-CM | POA: Diagnosis not present

## 2017-02-06 DIAGNOSIS — E785 Hyperlipidemia, unspecified: Secondary | ICD-10-CM | POA: Diagnosis not present

## 2017-02-06 DIAGNOSIS — I10 Essential (primary) hypertension: Secondary | ICD-10-CM | POA: Diagnosis not present

## 2017-02-06 DIAGNOSIS — K5900 Constipation, unspecified: Secondary | ICD-10-CM | POA: Diagnosis not present

## 2017-02-06 DIAGNOSIS — Z7689 Persons encountering health services in other specified circumstances: Secondary | ICD-10-CM | POA: Diagnosis not present

## 2017-02-06 DIAGNOSIS — C9200 Acute myeloblastic leukemia, not having achieved remission: Secondary | ICD-10-CM | POA: Diagnosis not present

## 2017-02-14 DIAGNOSIS — Z87891 Personal history of nicotine dependence: Secondary | ICD-10-CM | POA: Diagnosis not present

## 2017-02-14 DIAGNOSIS — J449 Chronic obstructive pulmonary disease, unspecified: Secondary | ICD-10-CM | POA: Diagnosis not present

## 2017-02-18 DIAGNOSIS — Z23 Encounter for immunization: Secondary | ICD-10-CM | POA: Diagnosis not present

## 2017-02-18 DIAGNOSIS — D6949 Other primary thrombocytopenia: Secondary | ICD-10-CM | POA: Diagnosis not present

## 2017-02-18 DIAGNOSIS — R400 Somnolence: Secondary | ICD-10-CM | POA: Diagnosis not present

## 2017-02-18 DIAGNOSIS — G4700 Insomnia, unspecified: Secondary | ICD-10-CM | POA: Diagnosis not present

## 2017-02-18 DIAGNOSIS — R634 Abnormal weight loss: Secondary | ICD-10-CM | POA: Diagnosis not present

## 2017-02-18 DIAGNOSIS — R1084 Generalized abdominal pain: Secondary | ICD-10-CM | POA: Diagnosis not present

## 2017-02-18 DIAGNOSIS — K5900 Constipation, unspecified: Secondary | ICD-10-CM | POA: Diagnosis not present

## 2017-02-18 DIAGNOSIS — C9200 Acute myeloblastic leukemia, not having achieved remission: Secondary | ICD-10-CM | POA: Diagnosis not present

## 2017-02-18 DIAGNOSIS — R432 Parageusia: Secondary | ICD-10-CM | POA: Diagnosis not present

## 2017-03-13 DIAGNOSIS — C9200 Acute myeloblastic leukemia, not having achieved remission: Secondary | ICD-10-CM | POA: Diagnosis not present

## 2017-03-13 DIAGNOSIS — E785 Hyperlipidemia, unspecified: Secondary | ICD-10-CM | POA: Diagnosis not present

## 2017-03-13 DIAGNOSIS — R109 Unspecified abdominal pain: Secondary | ICD-10-CM | POA: Diagnosis not present

## 2017-03-13 DIAGNOSIS — K5900 Constipation, unspecified: Secondary | ICD-10-CM | POA: Diagnosis not present

## 2017-03-13 DIAGNOSIS — J449 Chronic obstructive pulmonary disease, unspecified: Secondary | ICD-10-CM | POA: Diagnosis not present

## 2017-03-13 DIAGNOSIS — I700 Atherosclerosis of aorta: Secondary | ICD-10-CM | POA: Diagnosis not present

## 2017-03-13 DIAGNOSIS — Z87891 Personal history of nicotine dependence: Secondary | ICD-10-CM | POA: Diagnosis not present

## 2017-03-13 DIAGNOSIS — E538 Deficiency of other specified B group vitamins: Secondary | ICD-10-CM | POA: Diagnosis not present

## 2017-03-13 DIAGNOSIS — I10 Essential (primary) hypertension: Secondary | ICD-10-CM | POA: Diagnosis not present

## 2017-03-22 DIAGNOSIS — C9200 Acute myeloblastic leukemia, not having achieved remission: Secondary | ICD-10-CM | POA: Diagnosis not present

## 2017-03-22 DIAGNOSIS — K811 Chronic cholecystitis: Secondary | ICD-10-CM | POA: Diagnosis not present

## 2017-03-22 DIAGNOSIS — I10 Essential (primary) hypertension: Secondary | ICD-10-CM | POA: Diagnosis not present

## 2017-03-22 DIAGNOSIS — Z66 Do not resuscitate: Secondary | ICD-10-CM | POA: Diagnosis not present

## 2017-03-22 DIAGNOSIS — K5900 Constipation, unspecified: Secondary | ICD-10-CM | POA: Diagnosis not present

## 2017-03-22 DIAGNOSIS — R1084 Generalized abdominal pain: Secondary | ICD-10-CM | POA: Diagnosis not present

## 2017-03-22 DIAGNOSIS — I9762 Postprocedural hemorrhage of a circulatory system organ or structure following other procedure: Secondary | ICD-10-CM | POA: Diagnosis not present

## 2017-03-22 DIAGNOSIS — J449 Chronic obstructive pulmonary disease, unspecified: Secondary | ICD-10-CM | POA: Diagnosis not present

## 2017-03-22 DIAGNOSIS — C9230 Myeloid sarcoma, not having achieved remission: Secondary | ICD-10-CM | POA: Diagnosis not present

## 2017-03-22 DIAGNOSIS — D7589 Other specified diseases of blood and blood-forming organs: Secondary | ICD-10-CM | POA: Diagnosis not present

## 2017-03-22 DIAGNOSIS — R109 Unspecified abdominal pain: Secondary | ICD-10-CM | POA: Diagnosis not present

## 2017-03-22 DIAGNOSIS — D696 Thrombocytopenia, unspecified: Secondary | ICD-10-CM | POA: Diagnosis not present

## 2017-03-22 DIAGNOSIS — K5909 Other constipation: Secondary | ICD-10-CM | POA: Diagnosis not present

## 2017-03-22 DIAGNOSIS — E785 Hyperlipidemia, unspecified: Secondary | ICD-10-CM | POA: Diagnosis not present

## 2017-03-22 DIAGNOSIS — R590 Localized enlarged lymph nodes: Secondary | ICD-10-CM | POA: Diagnosis not present

## 2017-03-22 DIAGNOSIS — D6949 Other primary thrombocytopenia: Secondary | ICD-10-CM | POA: Diagnosis not present

## 2017-03-22 DIAGNOSIS — D649 Anemia, unspecified: Secondary | ICD-10-CM | POA: Diagnosis not present

## 2017-03-22 DIAGNOSIS — R591 Generalized enlarged lymph nodes: Secondary | ICD-10-CM | POA: Diagnosis not present

## 2017-03-22 DIAGNOSIS — R918 Other nonspecific abnormal finding of lung field: Secondary | ICD-10-CM | POA: Diagnosis not present

## 2017-03-23 DIAGNOSIS — I9581 Postprocedural hypotension: Secondary | ICD-10-CM | POA: Diagnosis not present

## 2017-03-23 DIAGNOSIS — I959 Hypotension, unspecified: Secondary | ICD-10-CM | POA: Diagnosis not present

## 2017-03-23 DIAGNOSIS — I10 Essential (primary) hypertension: Secondary | ICD-10-CM | POA: Diagnosis not present

## 2017-03-23 DIAGNOSIS — E876 Hypokalemia: Secondary | ICD-10-CM | POA: Diagnosis not present

## 2017-03-23 DIAGNOSIS — K819 Cholecystitis, unspecified: Secondary | ICD-10-CM | POA: Diagnosis not present

## 2017-03-23 DIAGNOSIS — Z87891 Personal history of nicotine dependence: Secondary | ICD-10-CM | POA: Diagnosis not present

## 2017-03-23 DIAGNOSIS — C9200 Acute myeloblastic leukemia, not having achieved remission: Secondary | ICD-10-CM | POA: Diagnosis not present

## 2017-03-23 DIAGNOSIS — Z66 Do not resuscitate: Secondary | ICD-10-CM | POA: Diagnosis not present

## 2017-03-23 DIAGNOSIS — K5900 Constipation, unspecified: Secondary | ICD-10-CM | POA: Diagnosis not present

## 2017-03-23 DIAGNOSIS — J449 Chronic obstructive pulmonary disease, unspecified: Secondary | ICD-10-CM | POA: Diagnosis not present

## 2017-03-23 DIAGNOSIS — D6949 Other primary thrombocytopenia: Secondary | ICD-10-CM | POA: Diagnosis not present

## 2017-03-23 DIAGNOSIS — I9762 Postprocedural hemorrhage of a circulatory system organ or structure following other procedure: Secondary | ICD-10-CM | POA: Diagnosis not present

## 2017-03-23 DIAGNOSIS — Z9049 Acquired absence of other specified parts of digestive tract: Secondary | ICD-10-CM | POA: Diagnosis not present

## 2017-03-23 DIAGNOSIS — K661 Hemoperitoneum: Secondary | ICD-10-CM | POA: Diagnosis not present

## 2017-03-23 DIAGNOSIS — R591 Generalized enlarged lymph nodes: Secondary | ICD-10-CM | POA: Diagnosis not present

## 2017-03-23 DIAGNOSIS — D696 Thrombocytopenia, unspecified: Secondary | ICD-10-CM | POA: Diagnosis not present

## 2017-03-23 DIAGNOSIS — S36892A Contusion of other intra-abdominal organs, initial encounter: Secondary | ICD-10-CM | POA: Diagnosis not present

## 2017-03-23 DIAGNOSIS — K9189 Other postprocedural complications and disorders of digestive system: Secondary | ICD-10-CM | POA: Diagnosis not present

## 2017-03-23 DIAGNOSIS — K811 Chronic cholecystitis: Secondary | ICD-10-CM | POA: Diagnosis not present

## 2017-03-23 DIAGNOSIS — K5909 Other constipation: Secondary | ICD-10-CM | POA: Diagnosis not present

## 2017-03-23 DIAGNOSIS — R590 Localized enlarged lymph nodes: Secondary | ICD-10-CM | POA: Diagnosis not present

## 2017-03-23 DIAGNOSIS — D649 Anemia, unspecified: Secondary | ICD-10-CM | POA: Diagnosis not present

## 2017-03-23 DIAGNOSIS — D62 Acute posthemorrhagic anemia: Secondary | ICD-10-CM | POA: Diagnosis not present

## 2017-03-23 DIAGNOSIS — C9230 Myeloid sarcoma, not having achieved remission: Secondary | ICD-10-CM | POA: Diagnosis not present

## 2017-03-23 DIAGNOSIS — D7589 Other specified diseases of blood and blood-forming organs: Secondary | ICD-10-CM | POA: Diagnosis not present

## 2017-03-23 DIAGNOSIS — R1084 Generalized abdominal pain: Secondary | ICD-10-CM | POA: Diagnosis not present

## 2017-03-23 DIAGNOSIS — E785 Hyperlipidemia, unspecified: Secondary | ICD-10-CM | POA: Diagnosis not present

## 2017-03-23 DIAGNOSIS — K8011 Calculus of gallbladder with chronic cholecystitis with obstruction: Secondary | ICD-10-CM | POA: Diagnosis not present

## 2017-03-23 DIAGNOSIS — R109 Unspecified abdominal pain: Secondary | ICD-10-CM | POA: Diagnosis not present

## 2017-03-31 DIAGNOSIS — R1084 Generalized abdominal pain: Secondary | ICD-10-CM | POA: Diagnosis not present

## 2017-03-31 DIAGNOSIS — K811 Chronic cholecystitis: Secondary | ICD-10-CM | POA: Diagnosis not present

## 2017-03-31 DIAGNOSIS — C9200 Acute myeloblastic leukemia, not having achieved remission: Secondary | ICD-10-CM | POA: Diagnosis not present

## 2017-04-07 DIAGNOSIS — R682 Dry mouth, unspecified: Secondary | ICD-10-CM | POA: Diagnosis not present

## 2017-04-07 DIAGNOSIS — C9200 Acute myeloblastic leukemia, not having achieved remission: Secondary | ICD-10-CM | POA: Diagnosis not present

## 2017-04-07 DIAGNOSIS — Z9049 Acquired absence of other specified parts of digestive tract: Secondary | ICD-10-CM | POA: Diagnosis not present

## 2017-04-07 DIAGNOSIS — K5900 Constipation, unspecified: Secondary | ICD-10-CM | POA: Diagnosis not present

## 2017-04-07 DIAGNOSIS — R1900 Intra-abdominal and pelvic swelling, mass and lump, unspecified site: Secondary | ICD-10-CM | POA: Diagnosis not present

## 2017-04-07 DIAGNOSIS — E876 Hypokalemia: Secondary | ICD-10-CM | POA: Diagnosis not present

## 2017-04-07 DIAGNOSIS — D696 Thrombocytopenia, unspecified: Secondary | ICD-10-CM | POA: Diagnosis not present

## 2017-04-07 DIAGNOSIS — R109 Unspecified abdominal pain: Secondary | ICD-10-CM | POA: Diagnosis not present

## 2017-04-10 DIAGNOSIS — C9200 Acute myeloblastic leukemia, not having achieved remission: Secondary | ICD-10-CM | POA: Diagnosis not present

## 2017-04-14 DIAGNOSIS — C9200 Acute myeloblastic leukemia, not having achieved remission: Secondary | ICD-10-CM | POA: Diagnosis not present

## 2017-04-17 DIAGNOSIS — R69 Illness, unspecified: Secondary | ICD-10-CM | POA: Diagnosis not present

## 2017-04-17 DIAGNOSIS — C9200 Acute myeloblastic leukemia, not having achieved remission: Secondary | ICD-10-CM | POA: Diagnosis not present

## 2017-04-17 DIAGNOSIS — I444 Left anterior fascicular block: Secondary | ICD-10-CM | POA: Diagnosis not present

## 2017-04-17 DIAGNOSIS — R0602 Shortness of breath: Secondary | ICD-10-CM | POA: Diagnosis not present

## 2017-04-17 DIAGNOSIS — R112 Nausea with vomiting, unspecified: Secondary | ICD-10-CM | POA: Diagnosis not present

## 2017-04-17 DIAGNOSIS — R1084 Generalized abdominal pain: Secondary | ICD-10-CM | POA: Diagnosis not present

## 2017-04-17 DIAGNOSIS — G8918 Other acute postprocedural pain: Secondary | ICD-10-CM | POA: Diagnosis not present

## 2017-04-17 DIAGNOSIS — S36899A Unspecified injury of other intra-abdominal organs, initial encounter: Secondary | ICD-10-CM | POA: Diagnosis not present

## 2017-04-18 DIAGNOSIS — I444 Left anterior fascicular block: Secondary | ICD-10-CM | POA: Diagnosis not present

## 2017-04-22 DIAGNOSIS — C9252 Acute myelomonocytic leukemia, in relapse: Secondary | ICD-10-CM | POA: Diagnosis not present

## 2017-04-22 DIAGNOSIS — Y92230 Patient room in hospital as the place of occurrence of the external cause: Secondary | ICD-10-CM | POA: Diagnosis not present

## 2017-04-22 DIAGNOSIS — D72829 Elevated white blood cell count, unspecified: Secondary | ICD-10-CM | POA: Diagnosis not present

## 2017-04-22 DIAGNOSIS — R918 Other nonspecific abnormal finding of lung field: Secondary | ICD-10-CM | POA: Diagnosis not present

## 2017-04-22 DIAGNOSIS — I610 Nontraumatic intracerebral hemorrhage in hemisphere, subcortical: Secondary | ICD-10-CM | POA: Diagnosis not present

## 2017-04-22 DIAGNOSIS — E860 Dehydration: Secondary | ICD-10-CM | POA: Diagnosis not present

## 2017-04-22 DIAGNOSIS — C9200 Acute myeloblastic leukemia, not having achieved remission: Secondary | ICD-10-CM | POA: Diagnosis not present

## 2017-04-22 DIAGNOSIS — D696 Thrombocytopenia, unspecified: Secondary | ICD-10-CM | POA: Diagnosis not present

## 2017-04-22 DIAGNOSIS — S065X9A Traumatic subdural hemorrhage with loss of consciousness of unspecified duration, initial encounter: Secondary | ICD-10-CM | POA: Diagnosis not present

## 2017-04-22 DIAGNOSIS — C9202 Acute myeloblastic leukemia, in relapse: Secondary | ICD-10-CM | POA: Diagnosis not present

## 2017-04-22 DIAGNOSIS — I6200 Nontraumatic subdural hemorrhage, unspecified: Secondary | ICD-10-CM | POA: Diagnosis not present

## 2017-04-22 DIAGNOSIS — R1013 Epigastric pain: Secondary | ICD-10-CM | POA: Diagnosis not present

## 2017-04-22 DIAGNOSIS — A419 Sepsis, unspecified organism: Secondary | ICD-10-CM | POA: Diagnosis not present

## 2017-04-22 DIAGNOSIS — R112 Nausea with vomiting, unspecified: Secondary | ICD-10-CM | POA: Diagnosis not present

## 2017-04-22 DIAGNOSIS — R410 Disorientation, unspecified: Secondary | ICD-10-CM | POA: Diagnosis not present

## 2017-04-22 DIAGNOSIS — E870 Hyperosmolality and hypernatremia: Secondary | ICD-10-CM | POA: Diagnosis not present

## 2017-04-22 DIAGNOSIS — D709 Neutropenia, unspecified: Secondary | ICD-10-CM | POA: Diagnosis not present

## 2017-04-22 DIAGNOSIS — R0682 Tachypnea, not elsewhere classified: Secondary | ICD-10-CM | POA: Diagnosis not present

## 2017-04-22 DIAGNOSIS — S065X9D Traumatic subdural hemorrhage with loss of consciousness of unspecified duration, subsequent encounter: Secondary | ICD-10-CM | POA: Diagnosis not present

## 2017-04-22 DIAGNOSIS — S0990XA Unspecified injury of head, initial encounter: Secondary | ICD-10-CM | POA: Diagnosis not present

## 2017-04-22 DIAGNOSIS — I639 Cerebral infarction, unspecified: Secondary | ICD-10-CM | POA: Diagnosis not present

## 2017-04-22 DIAGNOSIS — E876 Hypokalemia: Secondary | ICD-10-CM | POA: Diagnosis not present

## 2017-04-22 DIAGNOSIS — R509 Fever, unspecified: Secondary | ICD-10-CM | POA: Diagnosis not present

## 2017-04-22 DIAGNOSIS — R740 Nonspecific elevation of levels of transaminase and lactic acid dehydrogenase [LDH]: Secondary | ICD-10-CM | POA: Diagnosis not present

## 2017-04-22 DIAGNOSIS — I10 Essential (primary) hypertension: Secondary | ICD-10-CM | POA: Diagnosis not present

## 2017-04-22 DIAGNOSIS — Z66 Do not resuscitate: Secondary | ICD-10-CM | POA: Diagnosis not present

## 2017-04-22 DIAGNOSIS — Z5111 Encounter for antineoplastic chemotherapy: Secondary | ICD-10-CM | POA: Diagnosis not present

## 2017-04-22 DIAGNOSIS — K219 Gastro-esophageal reflux disease without esophagitis: Secondary | ICD-10-CM | POA: Diagnosis not present

## 2017-04-22 DIAGNOSIS — R109 Unspecified abdominal pain: Secondary | ICD-10-CM | POA: Diagnosis not present

## 2017-04-22 DIAGNOSIS — D61810 Antineoplastic chemotherapy induced pancytopenia: Secondary | ICD-10-CM | POA: Diagnosis not present

## 2017-04-22 DIAGNOSIS — N766 Ulceration of vulva: Secondary | ICD-10-CM | POA: Diagnosis not present

## 2017-04-22 DIAGNOSIS — W06XXXA Fall from bed, initial encounter: Secondary | ICD-10-CM | POA: Diagnosis not present

## 2017-04-22 DIAGNOSIS — G9341 Metabolic encephalopathy: Secondary | ICD-10-CM | POA: Diagnosis not present

## 2017-04-22 DIAGNOSIS — N9089 Other specified noninflammatory disorders of vulva and perineum: Secondary | ICD-10-CM | POA: Diagnosis not present

## 2017-04-22 DIAGNOSIS — J449 Chronic obstructive pulmonary disease, unspecified: Secondary | ICD-10-CM | POA: Diagnosis not present

## 2017-04-22 DIAGNOSIS — D61818 Other pancytopenia: Secondary | ICD-10-CM | POA: Diagnosis not present

## 2017-04-22 DIAGNOSIS — N179 Acute kidney failure, unspecified: Secondary | ICD-10-CM | POA: Diagnosis not present

## 2017-04-22 DIAGNOSIS — Z9049 Acquired absence of other specified parts of digestive tract: Secondary | ICD-10-CM | POA: Diagnosis not present

## 2017-04-22 DIAGNOSIS — Y93H3 Activity, building and construction: Secondary | ICD-10-CM | POA: Diagnosis not present

## 2017-04-22 DIAGNOSIS — E883 Tumor lysis syndrome: Secondary | ICD-10-CM | POA: Diagnosis not present

## 2017-04-22 DIAGNOSIS — R262 Difficulty in walking, not elsewhere classified: Secondary | ICD-10-CM | POA: Diagnosis not present

## 2017-04-22 DIAGNOSIS — E873 Alkalosis: Secondary | ICD-10-CM | POA: Diagnosis not present

## 2017-04-22 DIAGNOSIS — M6281 Muscle weakness (generalized): Secondary | ICD-10-CM | POA: Diagnosis not present

## 2017-04-22 DIAGNOSIS — E790 Hyperuricemia without signs of inflammatory arthritis and tophaceous disease: Secondary | ICD-10-CM | POA: Diagnosis not present

## 2017-05-17 DIAGNOSIS — Z8679 Personal history of other diseases of the circulatory system: Secondary | ICD-10-CM | POA: Diagnosis not present

## 2017-05-17 DIAGNOSIS — E7220 Disorder of urea cycle metabolism, unspecified: Secondary | ICD-10-CM | POA: Diagnosis not present

## 2017-05-17 DIAGNOSIS — Z9911 Dependence on respirator [ventilator] status: Secondary | ICD-10-CM | POA: Diagnosis not present

## 2017-05-17 DIAGNOSIS — E870 Hyperosmolality and hypernatremia: Secondary | ICD-10-CM | POA: Diagnosis not present

## 2017-05-17 DIAGNOSIS — D709 Neutropenia, unspecified: Secondary | ICD-10-CM | POA: Diagnosis not present

## 2017-05-17 DIAGNOSIS — I6201 Nontraumatic acute subdural hemorrhage: Secondary | ICD-10-CM | POA: Diagnosis not present

## 2017-05-17 DIAGNOSIS — R740 Nonspecific elevation of levels of transaminase and lactic acid dehydrogenase [LDH]: Secondary | ICD-10-CM | POA: Diagnosis not present

## 2017-05-17 DIAGNOSIS — K922 Gastrointestinal hemorrhage, unspecified: Secondary | ICD-10-CM | POA: Diagnosis not present

## 2017-05-17 DIAGNOSIS — R188 Other ascites: Secondary | ICD-10-CM | POA: Diagnosis not present

## 2017-05-17 DIAGNOSIS — E883 Tumor lysis syndrome: Secondary | ICD-10-CM | POA: Diagnosis not present

## 2017-05-17 DIAGNOSIS — R402422 Glasgow coma scale score 9-12, at arrival to emergency department: Secondary | ICD-10-CM | POA: Diagnosis not present

## 2017-05-17 DIAGNOSIS — R000 Tachycardia, unspecified: Secondary | ICD-10-CM | POA: Diagnosis not present

## 2017-05-17 DIAGNOSIS — I6200 Nontraumatic subdural hemorrhage, unspecified: Secondary | ICD-10-CM | POA: Diagnosis not present

## 2017-05-17 DIAGNOSIS — I6203 Nontraumatic chronic subdural hemorrhage: Secondary | ICD-10-CM | POA: Diagnosis not present

## 2017-05-17 DIAGNOSIS — C9200 Acute myeloblastic leukemia, not having achieved remission: Secondary | ICD-10-CM | POA: Diagnosis not present

## 2017-05-17 DIAGNOSIS — E440 Moderate protein-calorie malnutrition: Secondary | ICD-10-CM | POA: Diagnosis not present

## 2017-05-17 DIAGNOSIS — R262 Difficulty in walking, not elsewhere classified: Secondary | ICD-10-CM | POA: Diagnosis not present

## 2017-05-17 DIAGNOSIS — M6281 Muscle weakness (generalized): Secondary | ICD-10-CM | POA: Diagnosis not present

## 2017-05-17 DIAGNOSIS — I4589 Other specified conduction disorders: Secondary | ICD-10-CM | POA: Diagnosis not present

## 2017-05-17 DIAGNOSIS — E876 Hypokalemia: Secondary | ICD-10-CM | POA: Diagnosis not present

## 2017-05-17 DIAGNOSIS — N179 Acute kidney failure, unspecified: Secondary | ICD-10-CM | POA: Diagnosis not present

## 2017-05-17 DIAGNOSIS — R1909 Other intra-abdominal and pelvic swelling, mass and lump: Secondary | ICD-10-CM | POA: Diagnosis not present

## 2017-05-17 DIAGNOSIS — G9340 Encephalopathy, unspecified: Secondary | ICD-10-CM | POA: Diagnosis not present

## 2017-05-17 DIAGNOSIS — D696 Thrombocytopenia, unspecified: Secondary | ICD-10-CM | POA: Diagnosis not present

## 2017-05-17 DIAGNOSIS — S065X9D Traumatic subdural hemorrhage with loss of consciousness of unspecified duration, subsequent encounter: Secondary | ICD-10-CM | POA: Diagnosis not present

## 2017-05-17 DIAGNOSIS — R634 Abnormal weight loss: Secondary | ICD-10-CM | POA: Diagnosis not present

## 2017-05-17 DIAGNOSIS — R6521 Severe sepsis with septic shock: Secondary | ICD-10-CM | POA: Diagnosis not present

## 2017-05-17 DIAGNOSIS — I491 Atrial premature depolarization: Secondary | ICD-10-CM | POA: Diagnosis not present

## 2017-05-17 DIAGNOSIS — I4891 Unspecified atrial fibrillation: Secondary | ICD-10-CM | POA: Diagnosis not present

## 2017-05-17 DIAGNOSIS — R410 Disorientation, unspecified: Secondary | ICD-10-CM | POA: Diagnosis not present

## 2017-05-17 DIAGNOSIS — S065X9A Traumatic subdural hemorrhage with loss of consciousness of unspecified duration, initial encounter: Secondary | ICD-10-CM | POA: Diagnosis not present

## 2017-05-17 DIAGNOSIS — C9202 Acute myeloblastic leukemia, in relapse: Secondary | ICD-10-CM | POA: Diagnosis not present

## 2017-05-17 DIAGNOSIS — I493 Ventricular premature depolarization: Secondary | ICD-10-CM | POA: Diagnosis not present

## 2017-05-17 DIAGNOSIS — D61818 Other pancytopenia: Secondary | ICD-10-CM | POA: Diagnosis not present

## 2017-05-17 DIAGNOSIS — X58XXXA Exposure to other specified factors, initial encounter: Secondary | ICD-10-CM | POA: Diagnosis not present

## 2017-05-17 DIAGNOSIS — I071 Rheumatic tricuspid insufficiency: Secondary | ICD-10-CM | POA: Diagnosis not present

## 2017-05-17 DIAGNOSIS — D500 Iron deficiency anemia secondary to blood loss (chronic): Secondary | ICD-10-CM | POA: Diagnosis not present

## 2017-05-17 DIAGNOSIS — I959 Hypotension, unspecified: Secondary | ICD-10-CM | POA: Diagnosis not present

## 2017-05-17 DIAGNOSIS — C9230 Myeloid sarcoma, not having achieved remission: Secondary | ICD-10-CM | POA: Diagnosis not present

## 2017-05-17 DIAGNOSIS — R112 Nausea with vomiting, unspecified: Secondary | ICD-10-CM | POA: Diagnosis not present

## 2017-05-17 DIAGNOSIS — J9600 Acute respiratory failure, unspecified whether with hypoxia or hypercapnia: Secondary | ICD-10-CM | POA: Diagnosis not present

## 2017-05-17 DIAGNOSIS — D61810 Antineoplastic chemotherapy induced pancytopenia: Secondary | ICD-10-CM | POA: Diagnosis not present

## 2017-05-17 DIAGNOSIS — A419 Sepsis, unspecified organism: Secondary | ICD-10-CM | POA: Diagnosis not present

## 2017-05-17 DIAGNOSIS — Z7189 Other specified counseling: Secondary | ICD-10-CM | POA: Diagnosis not present

## 2017-05-17 DIAGNOSIS — K219 Gastro-esophageal reflux disease without esophagitis: Secondary | ICD-10-CM | POA: Diagnosis not present

## 2017-05-17 DIAGNOSIS — Z66 Do not resuscitate: Secondary | ICD-10-CM | POA: Diagnosis not present

## 2017-05-17 DIAGNOSIS — I469 Cardiac arrest, cause unspecified: Secondary | ICD-10-CM | POA: Diagnosis not present

## 2017-05-17 DIAGNOSIS — Y998 Other external cause status: Secondary | ICD-10-CM | POA: Diagnosis not present

## 2017-05-17 DIAGNOSIS — Z4682 Encounter for fitting and adjustment of non-vascular catheter: Secondary | ICD-10-CM | POA: Diagnosis not present

## 2017-05-17 DIAGNOSIS — D72829 Elevated white blood cell count, unspecified: Secondary | ICD-10-CM | POA: Diagnosis not present

## 2017-05-17 DIAGNOSIS — D649 Anemia, unspecified: Secondary | ICD-10-CM | POA: Diagnosis not present

## 2017-05-17 DIAGNOSIS — C9252 Acute myelomonocytic leukemia, in relapse: Secondary | ICD-10-CM | POA: Diagnosis not present

## 2017-05-17 DIAGNOSIS — I351 Nonrheumatic aortic (valve) insufficiency: Secondary | ICD-10-CM | POA: Diagnosis not present

## 2017-05-17 DIAGNOSIS — R4182 Altered mental status, unspecified: Secondary | ICD-10-CM | POA: Diagnosis not present

## 2017-05-17 DIAGNOSIS — I10 Essential (primary) hypertension: Secondary | ICD-10-CM | POA: Diagnosis not present

## 2017-05-17 DIAGNOSIS — D62 Acute posthemorrhagic anemia: Secondary | ICD-10-CM | POA: Diagnosis not present

## 2017-05-17 DIAGNOSIS — R918 Other nonspecific abnormal finding of lung field: Secondary | ICD-10-CM | POA: Diagnosis not present

## 2017-05-17 DIAGNOSIS — E872 Acidosis: Secondary | ICD-10-CM | POA: Diagnosis not present

## 2017-05-17 DIAGNOSIS — G935 Compression of brain: Secondary | ICD-10-CM | POA: Diagnosis not present

## 2017-05-17 DIAGNOSIS — J449 Chronic obstructive pulmonary disease, unspecified: Secondary | ICD-10-CM | POA: Diagnosis not present

## 2017-05-19 DIAGNOSIS — I10 Essential (primary) hypertension: Secondary | ICD-10-CM | POA: Diagnosis not present

## 2017-05-19 DIAGNOSIS — E440 Moderate protein-calorie malnutrition: Secondary | ICD-10-CM | POA: Diagnosis not present

## 2017-05-19 DIAGNOSIS — K922 Gastrointestinal hemorrhage, unspecified: Secondary | ICD-10-CM | POA: Diagnosis not present

## 2017-05-19 DIAGNOSIS — C9200 Acute myeloblastic leukemia, not having achieved remission: Secondary | ICD-10-CM | POA: Diagnosis not present

## 2017-05-22 DIAGNOSIS — D72829 Elevated white blood cell count, unspecified: Secondary | ICD-10-CM | POA: Diagnosis not present

## 2017-05-22 DIAGNOSIS — C9252 Acute myelomonocytic leukemia, in relapse: Secondary | ICD-10-CM | POA: Diagnosis not present

## 2017-05-22 DIAGNOSIS — D649 Anemia, unspecified: Secondary | ICD-10-CM | POA: Diagnosis not present

## 2017-05-23 DIAGNOSIS — R634 Abnormal weight loss: Secondary | ICD-10-CM | POA: Diagnosis not present

## 2017-05-23 DIAGNOSIS — C9252 Acute myelomonocytic leukemia, in relapse: Secondary | ICD-10-CM | POA: Diagnosis not present

## 2017-05-26 DIAGNOSIS — D500 Iron deficiency anemia secondary to blood loss (chronic): Secondary | ICD-10-CM | POA: Diagnosis not present

## 2017-05-26 DIAGNOSIS — D62 Acute posthemorrhagic anemia: Secondary | ICD-10-CM | POA: Diagnosis not present

## 2017-05-26 DIAGNOSIS — I071 Rheumatic tricuspid insufficiency: Secondary | ICD-10-CM | POA: Diagnosis not present

## 2017-05-26 DIAGNOSIS — S065X9A Traumatic subdural hemorrhage with loss of consciousness of unspecified duration, initial encounter: Secondary | ICD-10-CM | POA: Diagnosis not present

## 2017-05-26 DIAGNOSIS — Z7189 Other specified counseling: Secondary | ICD-10-CM | POA: Diagnosis not present

## 2017-05-26 DIAGNOSIS — Z8679 Personal history of other diseases of the circulatory system: Secondary | ICD-10-CM | POA: Diagnosis not present

## 2017-05-26 DIAGNOSIS — Y998 Other external cause status: Secondary | ICD-10-CM | POA: Diagnosis not present

## 2017-05-26 DIAGNOSIS — C9202 Acute myeloblastic leukemia, in relapse: Secondary | ICD-10-CM | POA: Diagnosis not present

## 2017-05-26 DIAGNOSIS — I493 Ventricular premature depolarization: Secondary | ICD-10-CM | POA: Diagnosis not present

## 2017-05-26 DIAGNOSIS — A419 Sepsis, unspecified organism: Secondary | ICD-10-CM | POA: Diagnosis not present

## 2017-05-26 DIAGNOSIS — R188 Other ascites: Secondary | ICD-10-CM | POA: Diagnosis not present

## 2017-05-26 DIAGNOSIS — I4891 Unspecified atrial fibrillation: Secondary | ICD-10-CM | POA: Diagnosis not present

## 2017-05-26 DIAGNOSIS — S0992XA Unspecified injury of nose, initial encounter: Secondary | ICD-10-CM | POA: Diagnosis not present

## 2017-05-26 DIAGNOSIS — I959 Hypotension, unspecified: Secondary | ICD-10-CM | POA: Diagnosis not present

## 2017-05-26 DIAGNOSIS — R740 Nonspecific elevation of levels of transaminase and lactic acid dehydrogenase [LDH]: Secondary | ICD-10-CM | POA: Diagnosis not present

## 2017-05-26 DIAGNOSIS — E870 Hyperosmolality and hypernatremia: Secondary | ICD-10-CM | POA: Diagnosis not present

## 2017-05-26 DIAGNOSIS — X58XXXA Exposure to other specified factors, initial encounter: Secondary | ICD-10-CM | POA: Diagnosis not present

## 2017-05-26 DIAGNOSIS — I6200 Nontraumatic subdural hemorrhage, unspecified: Secondary | ICD-10-CM | POA: Diagnosis not present

## 2017-05-26 DIAGNOSIS — N179 Acute kidney failure, unspecified: Secondary | ICD-10-CM | POA: Diagnosis not present

## 2017-05-26 DIAGNOSIS — J9600 Acute respiratory failure, unspecified whether with hypoxia or hypercapnia: Secondary | ICD-10-CM | POA: Diagnosis not present

## 2017-05-26 DIAGNOSIS — S065X9D Traumatic subdural hemorrhage with loss of consciousness of unspecified duration, subsequent encounter: Secondary | ICD-10-CM | POA: Diagnosis not present

## 2017-05-26 DIAGNOSIS — E883 Tumor lysis syndrome: Secondary | ICD-10-CM | POA: Diagnosis not present

## 2017-05-26 DIAGNOSIS — R1909 Other intra-abdominal and pelvic swelling, mass and lump: Secondary | ICD-10-CM | POA: Diagnosis not present

## 2017-05-26 DIAGNOSIS — R918 Other nonspecific abnormal finding of lung field: Secondary | ICD-10-CM | POA: Diagnosis not present

## 2017-05-26 DIAGNOSIS — E7220 Disorder of urea cycle metabolism, unspecified: Secondary | ICD-10-CM | POA: Diagnosis not present

## 2017-05-26 DIAGNOSIS — E872 Acidosis: Secondary | ICD-10-CM | POA: Diagnosis not present

## 2017-05-26 DIAGNOSIS — I4589 Other specified conduction disorders: Secondary | ICD-10-CM | POA: Diagnosis not present

## 2017-05-26 DIAGNOSIS — R4182 Altered mental status, unspecified: Secondary | ICD-10-CM | POA: Diagnosis not present

## 2017-05-26 DIAGNOSIS — I6201 Nontraumatic acute subdural hemorrhage: Secondary | ICD-10-CM | POA: Diagnosis not present

## 2017-05-26 DIAGNOSIS — R040 Epistaxis: Secondary | ICD-10-CM | POA: Diagnosis not present

## 2017-05-26 DIAGNOSIS — G935 Compression of brain: Secondary | ICD-10-CM | POA: Diagnosis not present

## 2017-05-26 DIAGNOSIS — R402422 Glasgow coma scale score 9-12, at arrival to emergency department: Secondary | ICD-10-CM | POA: Diagnosis not present

## 2017-05-26 DIAGNOSIS — D649 Anemia, unspecified: Secondary | ICD-10-CM | POA: Diagnosis not present

## 2017-05-26 DIAGNOSIS — R000 Tachycardia, unspecified: Secondary | ICD-10-CM | POA: Diagnosis not present

## 2017-05-26 DIAGNOSIS — I491 Atrial premature depolarization: Secondary | ICD-10-CM | POA: Diagnosis not present

## 2017-05-26 DIAGNOSIS — D72829 Elevated white blood cell count, unspecified: Secondary | ICD-10-CM | POA: Diagnosis not present

## 2017-05-26 DIAGNOSIS — D696 Thrombocytopenia, unspecified: Secondary | ICD-10-CM | POA: Diagnosis not present

## 2017-05-26 DIAGNOSIS — C9200 Acute myeloblastic leukemia, not having achieved remission: Secondary | ICD-10-CM | POA: Diagnosis not present

## 2017-05-26 DIAGNOSIS — Z66 Do not resuscitate: Secondary | ICD-10-CM | POA: Diagnosis not present

## 2017-05-26 DIAGNOSIS — Z9911 Dependence on respirator [ventilator] status: Secondary | ICD-10-CM | POA: Diagnosis not present

## 2017-05-26 DIAGNOSIS — G9340 Encephalopathy, unspecified: Secondary | ICD-10-CM | POA: Diagnosis not present

## 2017-05-26 DIAGNOSIS — J449 Chronic obstructive pulmonary disease, unspecified: Secondary | ICD-10-CM | POA: Diagnosis not present

## 2017-05-26 DIAGNOSIS — R9431 Abnormal electrocardiogram [ECG] [EKG]: Secondary | ICD-10-CM | POA: Diagnosis not present

## 2017-05-26 DIAGNOSIS — Z4682 Encounter for fitting and adjustment of non-vascular catheter: Secondary | ICD-10-CM | POA: Diagnosis not present

## 2017-05-26 DIAGNOSIS — C9230 Myeloid sarcoma, not having achieved remission: Secondary | ICD-10-CM | POA: Diagnosis not present

## 2017-05-26 DIAGNOSIS — I6203 Nontraumatic chronic subdural hemorrhage: Secondary | ICD-10-CM | POA: Diagnosis not present

## 2017-05-26 DIAGNOSIS — R0603 Acute respiratory distress: Secondary | ICD-10-CM | POA: Diagnosis not present

## 2017-05-26 DIAGNOSIS — I469 Cardiac arrest, cause unspecified: Secondary | ICD-10-CM | POA: Diagnosis not present

## 2017-05-26 DIAGNOSIS — I351 Nonrheumatic aortic (valve) insufficiency: Secondary | ICD-10-CM | POA: Diagnosis not present

## 2017-05-26 DIAGNOSIS — R6521 Severe sepsis with septic shock: Secondary | ICD-10-CM | POA: Diagnosis not present

## 2017-05-27 DIAGNOSIS — I351 Nonrheumatic aortic (valve) insufficiency: Secondary | ICD-10-CM | POA: Diagnosis not present

## 2017-05-27 DIAGNOSIS — I071 Rheumatic tricuspid insufficiency: Secondary | ICD-10-CM | POA: Diagnosis not present

## 2017-05-27 DIAGNOSIS — I6200 Nontraumatic subdural hemorrhage, unspecified: Secondary | ICD-10-CM | POA: Diagnosis not present

## 2017-05-27 DIAGNOSIS — I4589 Other specified conduction disorders: Secondary | ICD-10-CM | POA: Diagnosis not present

## 2017-05-27 DIAGNOSIS — I491 Atrial premature depolarization: Secondary | ICD-10-CM | POA: Diagnosis not present

## 2017-05-27 DIAGNOSIS — S065X9A Traumatic subdural hemorrhage with loss of consciousness of unspecified duration, initial encounter: Secondary | ICD-10-CM | POA: Diagnosis not present

## 2017-05-28 DIAGNOSIS — E7220 Disorder of urea cycle metabolism, unspecified: Secondary | ICD-10-CM | POA: Diagnosis not present

## 2017-05-28 DIAGNOSIS — Z4682 Encounter for fitting and adjustment of non-vascular catheter: Secondary | ICD-10-CM | POA: Diagnosis not present

## 2017-05-28 DIAGNOSIS — R040 Epistaxis: Secondary | ICD-10-CM | POA: Diagnosis not present

## 2017-05-28 DIAGNOSIS — Z8679 Personal history of other diseases of the circulatory system: Secondary | ICD-10-CM | POA: Diagnosis not present

## 2017-05-28 DIAGNOSIS — C9200 Acute myeloblastic leukemia, not having achieved remission: Secondary | ICD-10-CM | POA: Diagnosis not present

## 2017-05-28 DIAGNOSIS — R0603 Acute respiratory distress: Secondary | ICD-10-CM | POA: Diagnosis not present

## 2017-05-28 DIAGNOSIS — E872 Acidosis: Secondary | ICD-10-CM | POA: Diagnosis not present

## 2017-05-28 DIAGNOSIS — J449 Chronic obstructive pulmonary disease, unspecified: Secondary | ICD-10-CM | POA: Diagnosis not present

## 2017-05-28 DIAGNOSIS — S065X9A Traumatic subdural hemorrhage with loss of consciousness of unspecified duration, initial encounter: Secondary | ICD-10-CM | POA: Diagnosis not present

## 2017-05-28 DIAGNOSIS — S0992XA Unspecified injury of nose, initial encounter: Secondary | ICD-10-CM | POA: Diagnosis not present

## 2017-05-28 DIAGNOSIS — G9340 Encephalopathy, unspecified: Secondary | ICD-10-CM | POA: Diagnosis not present

## 2017-05-28 DIAGNOSIS — N179 Acute kidney failure, unspecified: Secondary | ICD-10-CM | POA: Diagnosis not present

## 2017-05-28 DIAGNOSIS — E883 Tumor lysis syndrome: Secondary | ICD-10-CM | POA: Diagnosis not present

## 2017-05-29 DIAGNOSIS — Z8679 Personal history of other diseases of the circulatory system: Secondary | ICD-10-CM | POA: Diagnosis not present

## 2017-05-29 DIAGNOSIS — G9340 Encephalopathy, unspecified: Secondary | ICD-10-CM | POA: Diagnosis not present

## 2017-05-29 DIAGNOSIS — C9200 Acute myeloblastic leukemia, not having achieved remission: Secondary | ICD-10-CM | POA: Diagnosis not present

## 2017-05-29 DIAGNOSIS — R040 Epistaxis: Secondary | ICD-10-CM | POA: Diagnosis not present

## 2017-05-29 DIAGNOSIS — E872 Acidosis: Secondary | ICD-10-CM | POA: Diagnosis not present

## 2017-05-29 DIAGNOSIS — R000 Tachycardia, unspecified: Secondary | ICD-10-CM | POA: Diagnosis not present

## 2017-05-29 DIAGNOSIS — E7220 Disorder of urea cycle metabolism, unspecified: Secondary | ICD-10-CM | POA: Diagnosis not present

## 2017-05-29 DIAGNOSIS — E883 Tumor lysis syndrome: Secondary | ICD-10-CM | POA: Diagnosis not present

## 2017-05-29 DIAGNOSIS — I491 Atrial premature depolarization: Secondary | ICD-10-CM | POA: Diagnosis not present

## 2017-05-29 DIAGNOSIS — N179 Acute kidney failure, unspecified: Secondary | ICD-10-CM | POA: Diagnosis not present

## 2017-05-29 DIAGNOSIS — J449 Chronic obstructive pulmonary disease, unspecified: Secondary | ICD-10-CM | POA: Diagnosis not present

## 2017-05-29 DIAGNOSIS — S0992XA Unspecified injury of nose, initial encounter: Secondary | ICD-10-CM | POA: Diagnosis not present

## 2017-05-30 DIAGNOSIS — D62 Acute posthemorrhagic anemia: Secondary | ICD-10-CM | POA: Diagnosis not present

## 2017-05-30 DIAGNOSIS — I4891 Unspecified atrial fibrillation: Secondary | ICD-10-CM | POA: Diagnosis not present

## 2017-05-30 DIAGNOSIS — J9600 Acute respiratory failure, unspecified whether with hypoxia or hypercapnia: Secondary | ICD-10-CM | POA: Diagnosis not present

## 2017-05-30 DIAGNOSIS — R000 Tachycardia, unspecified: Secondary | ICD-10-CM | POA: Diagnosis not present

## 2017-05-30 DIAGNOSIS — R9431 Abnormal electrocardiogram [ECG] [EKG]: Secondary | ICD-10-CM | POA: Diagnosis not present

## 2017-05-30 DIAGNOSIS — Z9911 Dependence on respirator [ventilator] status: Secondary | ICD-10-CM | POA: Diagnosis not present

## 2017-05-30 DIAGNOSIS — E870 Hyperosmolality and hypernatremia: Secondary | ICD-10-CM | POA: Diagnosis not present

## 2017-05-30 DIAGNOSIS — G9340 Encephalopathy, unspecified: Secondary | ICD-10-CM | POA: Diagnosis not present

## 2017-05-30 DIAGNOSIS — N179 Acute kidney failure, unspecified: Secondary | ICD-10-CM | POA: Diagnosis not present

## 2017-05-30 DIAGNOSIS — I6200 Nontraumatic subdural hemorrhage, unspecified: Secondary | ICD-10-CM | POA: Diagnosis not present

## 2017-05-30 DIAGNOSIS — I493 Ventricular premature depolarization: Secondary | ICD-10-CM | POA: Diagnosis not present

## 2017-05-30 DIAGNOSIS — R740 Nonspecific elevation of levels of transaminase and lactic acid dehydrogenase [LDH]: Secondary | ICD-10-CM | POA: Diagnosis not present

## 2017-05-30 DIAGNOSIS — D696 Thrombocytopenia, unspecified: Secondary | ICD-10-CM | POA: Diagnosis not present

## 2017-05-31 DIAGNOSIS — S065X9A Traumatic subdural hemorrhage with loss of consciousness of unspecified duration, initial encounter: Secondary | ICD-10-CM | POA: Diagnosis not present

## 2017-05-31 DIAGNOSIS — I469 Cardiac arrest, cause unspecified: Secondary | ICD-10-CM | POA: Diagnosis not present

## 2017-05-31 DIAGNOSIS — Z66 Do not resuscitate: Secondary | ICD-10-CM | POA: Diagnosis not present

## 2017-06-13 DEATH — deceased

## 2019-02-02 IMAGING — CT NM PET TUM IMG INITIAL (PI) SKULL BASE T - THIGH
7 series · 25 of 25 positions shown · non-contrast
Comparison: None.

CLINICAL DATA: Lymphadenopathy with weight loss.

EXAM:
NUCLEAR MEDICINE PET SKULL BASE TO THIGH
TECHNIQUE: 7.02 mCi F-18 FDG was injected intravenously. Full-ring PET imaging
was performed from the skull base to thigh after the radiotracer. CT
data was obtained and used for attenuation correction and anatomic
localization.
FASTING BLOOD GLUCOSE:  Value: 97 mg/dl

[Series 3: pet sk_thigh ac · axial · 5.0mm · 4.07mm/px · z∈[-850,-22]mm · 5 of 208 slices shown]
[im 1/208]
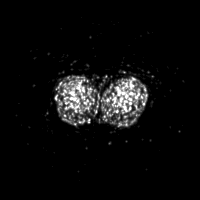
[im 52/208]
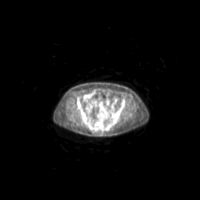
[im 104/208]
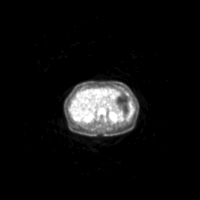
[im 156/208]
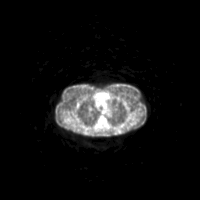
[im 208/208]
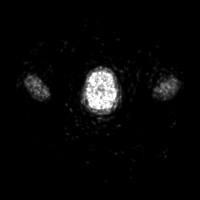

[Series 4: ct sk_thigh 5.0 b31f · axial · 5.0mm · 0.91mm/px · z∈[-850,-22]mm · 5 of 207 slices shown]
[im 1/207]
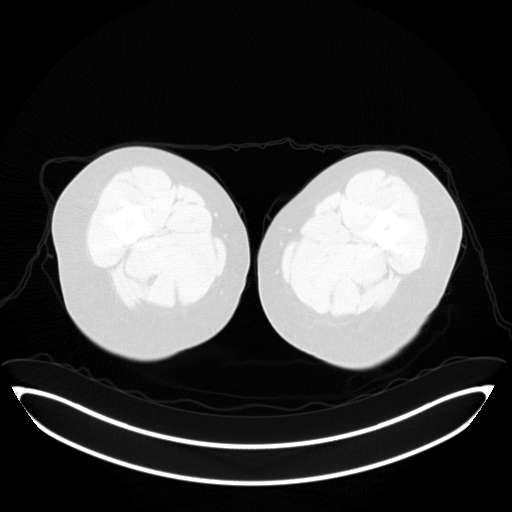
[im 52/207]
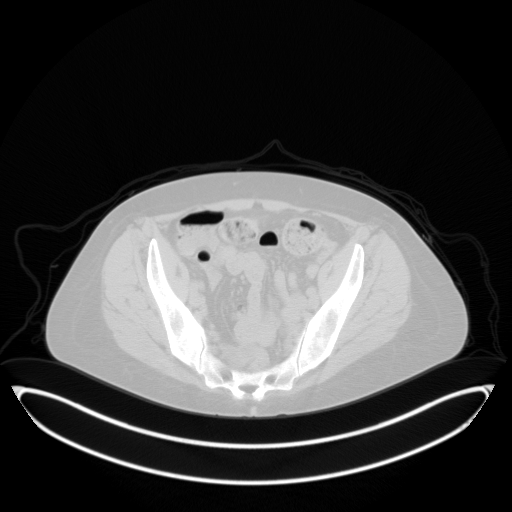
[im 104/207]
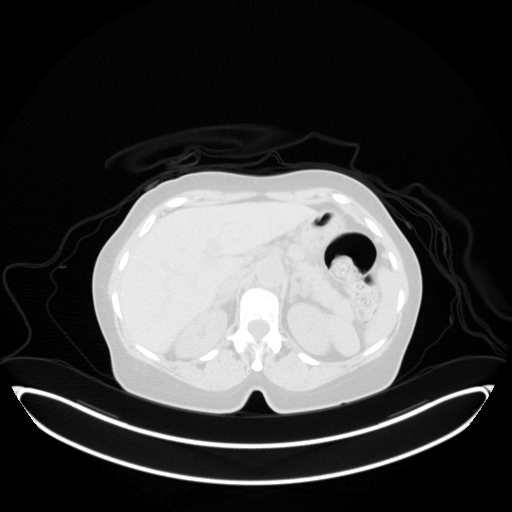
[im 155/207]
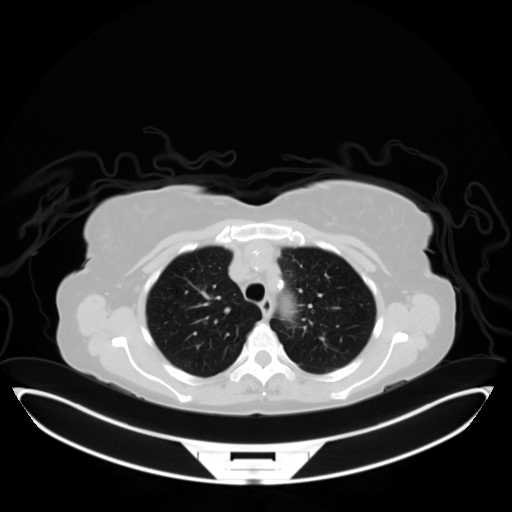
[im 207/207  brain]
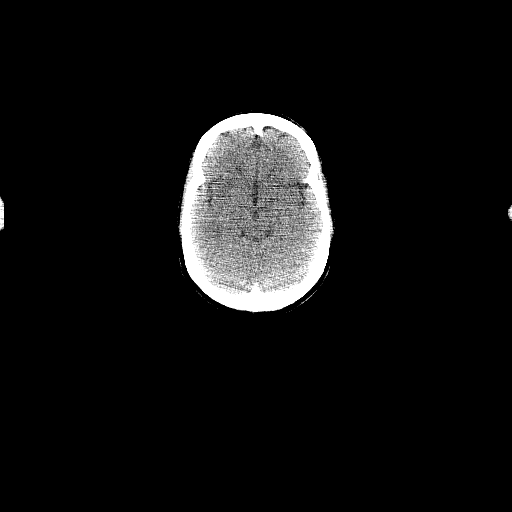

[Series 7: ct sk_thigh 5.0 b70f (id)_bone · axial · 5.0mm · 0.62mm/px · z∈[-448,-152]mm · 2 of 75 slices shown]
[im 1/75  bone]
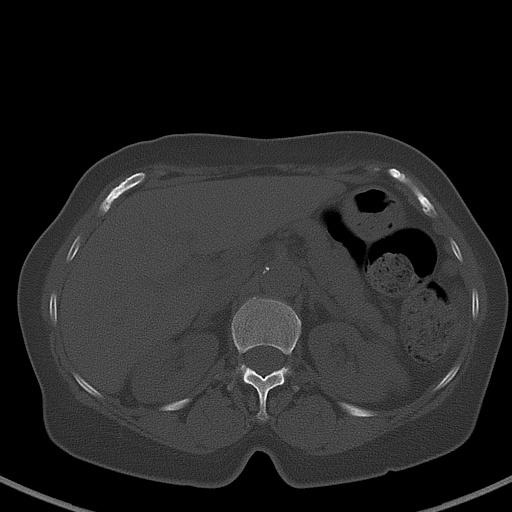
[im 75/75  bone]
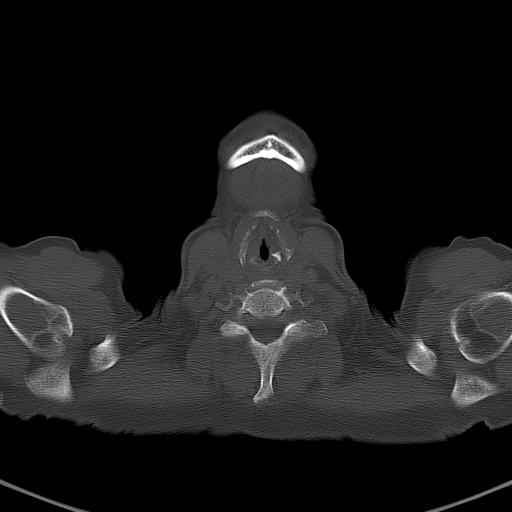

[Series 8: pet sk_thigh nac · axial · 5.0mm · 4.07mm/px · z∈[-850,-22]mm · 5 of 208 slices shown]
[im 1/208]
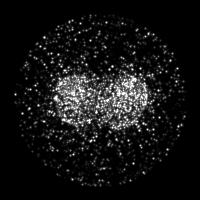
[im 52/208]
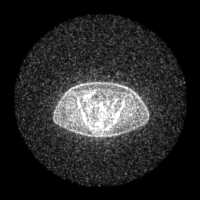
[im 104/208]
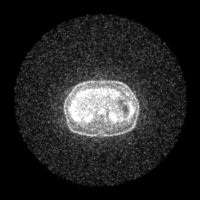
[im 156/208]
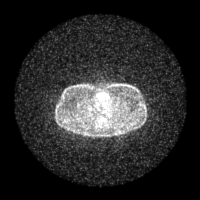
[im 208/208]
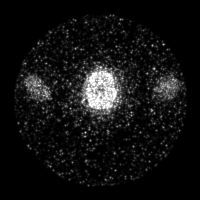

[Series 604: range-ct sk_thigh 5.0 (id)<alpha range> · 2 of 68 slices shown (1 of 2)]
[im 1/68]
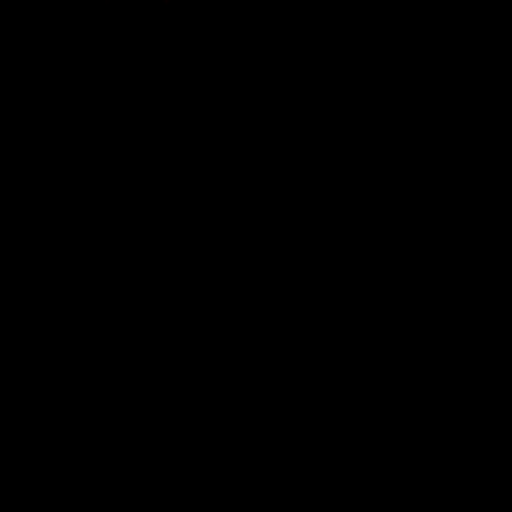
[im 68/68]
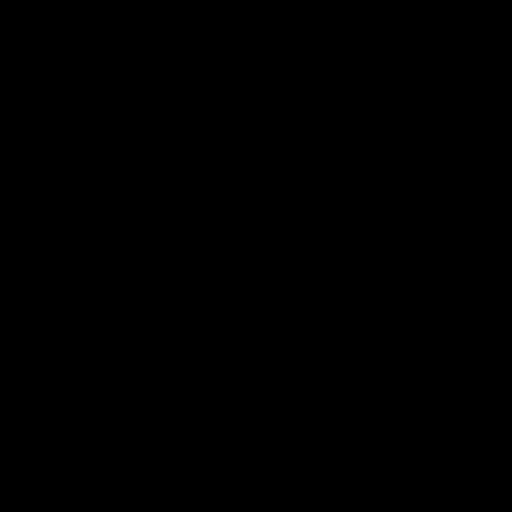

[Series 605: mip collection · coronal · 1.72mm/px · 1 of 32 slices shown]
[im 1/32]
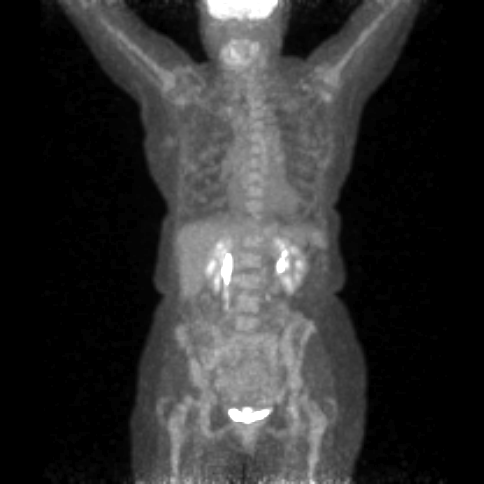

[Series 606: range-ct sk_thigh 5.0 (id)<alpha range> · 5 of 193 slices shown (2 of 2)]
[im 1/193]
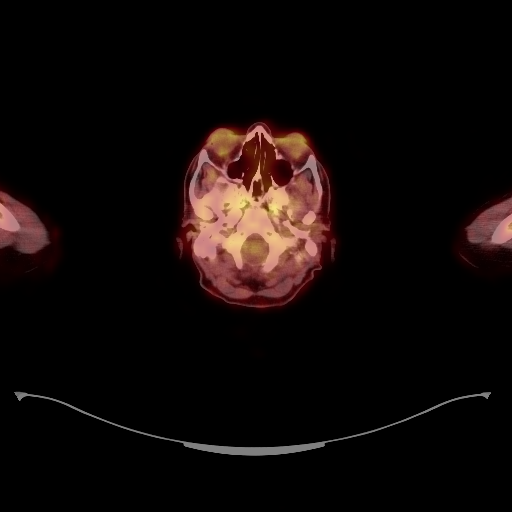
[im 49/193]
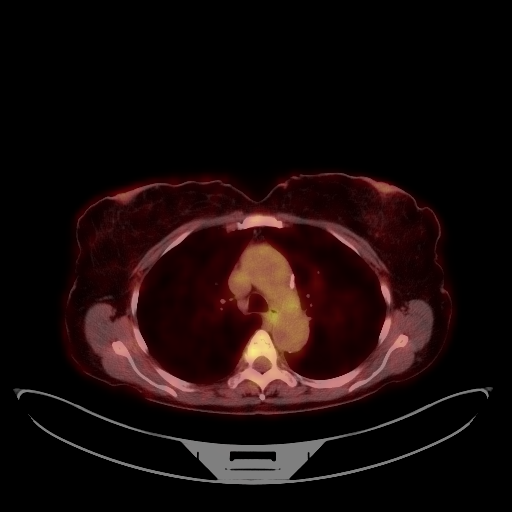
[im 97/193]
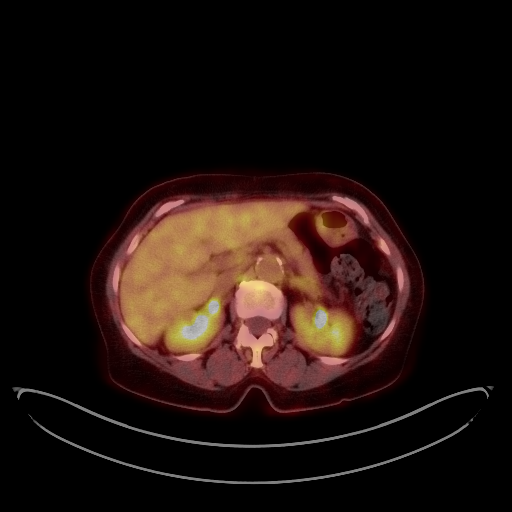
[im 145/193]
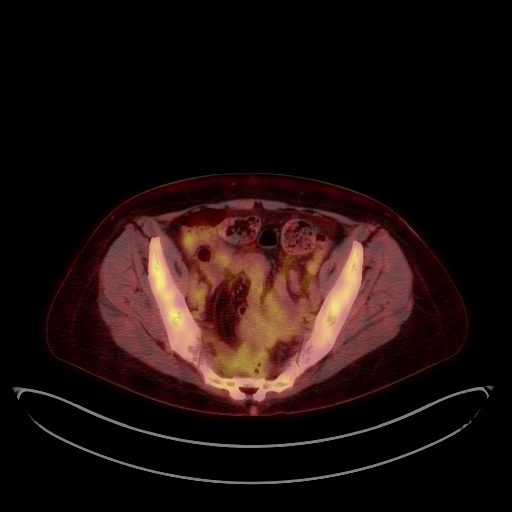
[im 193/193]
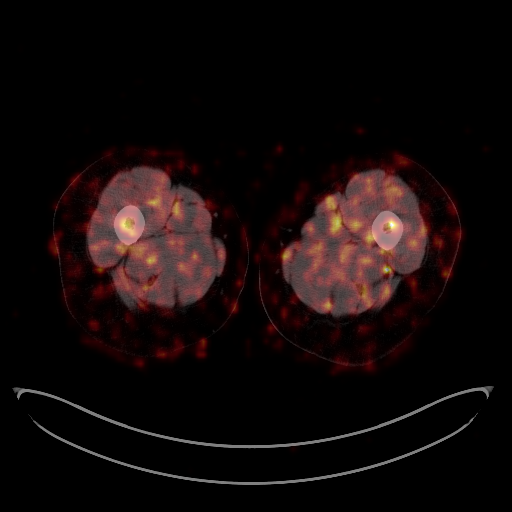

[25 of 25 positions shown; findings below may reference images not displayed]

FINDINGS: NECK

The pharyngeal uptake is slightly greater on the right than the left
with mucus in this region on the CT imaging. I suspect the level of
pharyngeal uptake is within physiologic normal limits. No discrete
mass identified. No other abnormalities identified in the head or
neck region.

CHEST

There is a small spiculated nodule in the right upper lobe as seen
on series 4, image 53 measuring 7.3 mm. There is increased FDG
uptake in this nodule with a maximum SUV of 2.03. A smaller nodule
in the right apex is seen on series 4, image 49 measuring 3 mm
without obvious FDG uptake. There is mild ground-glass opacity in
the left upper lobe on series 4, image 69 with low level FDG uptake,
likely infectious or inflammatory. No other abnormal uptake within
the lungs. Uptake within the esophagus may be physiologic.
Esophagitis is possible. There is mild uptake in a small hiatal
hernia as well. No adenopathy identified in the chest.

ABDOMEN/PELVIS

Multiple shotty and mildly prominent nodes are seen in the
retroperitoneum. For example, there is a node posterior to the aorta
on image 122, an aortocaval node on image 126, and several nodes in
the aortocaval region and anterior to the right IVC on images 132
through 137. I also suspect nodes to the right of the IVC such as on
images 121 through 124. The maximum SUV of a node on image 121 is
4.51. It is difficult to accurately measure this node without
contrast. A mildly prominent node to the left of the aorta on series
4, image 105 measures 9.3 mm with a maximum SUV of 3.7. No other
abnormal soft tissue uptake in the abdomen or pelvis.

SKELETON

There is diffuse increased uptake throughout the visualized bones
including the proximal extremities an axial bones. The uptake in the
bones is greater than liver or mediastinal blood pool uptake. On CT
imaging, no focal bony lesions are identified.
IMPRESSION: 1. The mildly avid spiculated nodule in the right upper lobe is
concerning for a small primary lung malignancy.
2. Shotty and mildly prominent nodes in the retroperitoneum with low
level FDG uptake are not specific. These findings could be seen with
a low grade lymphoma. However, there are other potential causes
including reactive nodes or metastatic disease
3. Increased diffuse uptake in the bone marrow. If the patient is
found to have a low-grade lymphoma, the findings could represent
bone marrow involvement. However, there are other potential causes
for diffuse uptake within bone marrow including severe anemia,
leukemia, myelodysplastic syndromes, recent chemotherapy, and
granulocyte colony stimulating factor. Recommend clinical
correlation and correlation with labs.
